# Patient Record
Sex: Female | Born: 1995 | Race: White | Hispanic: Yes | Marital: Single | Smoking: Never smoker
Health system: Southern US, Community
[De-identification: ages and names within clinical notes are randomized; demographics above are authoritative.]

## PROBLEM LIST (undated history)

## (undated) DIAGNOSIS — F419 Anxiety disorder, unspecified: Secondary | ICD-10-CM

## (undated) DIAGNOSIS — R000 Tachycardia, unspecified: Secondary | ICD-10-CM

## (undated) DIAGNOSIS — T5491XA Toxic effect of unspecified corrosive substance, accidental (unintentional), initial encounter: Secondary | ICD-10-CM

## (undated) DIAGNOSIS — D649 Anemia, unspecified: Secondary | ICD-10-CM

## (undated) DIAGNOSIS — R569 Unspecified convulsions: Secondary | ICD-10-CM

## (undated) DIAGNOSIS — F32A Depression, unspecified: Secondary | ICD-10-CM

## (undated) DIAGNOSIS — T50901A Poisoning by unspecified drugs, medicaments and biological substances, accidental (unintentional), initial encounter: Secondary | ICD-10-CM

## (undated) DIAGNOSIS — K8020 Calculus of gallbladder without cholecystitis without obstruction: Secondary | ICD-10-CM

## (undated) HISTORY — DX: Anemia, unspecified: D64.9

## (undated) HISTORY — PX: CHOLECYSTECTOMY: SHX55

## (undated) HISTORY — DX: Unspecified convulsions: R56.9

## (undated) HISTORY — DX: Tachycardia, unspecified: R00.0

## (undated) HISTORY — DX: Depression, unspecified: F32.A

## (undated) HISTORY — DX: Anxiety disorder, unspecified: F41.9

## (undated) HISTORY — DX: Calculus of gallbladder without cholecystitis without obstruction: K80.20

---

## 2021-05-09 ENCOUNTER — Emergency Department (HOSPITAL_COMMUNITY): Payer: 59

## 2021-05-09 ENCOUNTER — Emergency Department (HOSPITAL_COMMUNITY)
Admission: EM | Admit: 2021-05-09 | Discharge: 2021-05-09 | Disposition: A | Discharge status: Home / Self Care | Payer: 59 | Attending: Emergency Medicine | Admitting: Emergency Medicine

## 2021-05-09 ENCOUNTER — Other Ambulatory Visit: Payer: Self-pay

## 2021-05-09 ENCOUNTER — Encounter (HOSPITAL_COMMUNITY): Payer: Self-pay

## 2021-05-09 DIAGNOSIS — R072 Precordial pain: Secondary | ICD-10-CM

## 2021-05-09 DIAGNOSIS — R0602 Shortness of breath: Secondary | ICD-10-CM

## 2021-05-09 DIAGNOSIS — T50901A Poisoning by unspecified drugs, medicaments and biological substances, accidental (unintentional), initial encounter: Secondary | ICD-10-CM

## 2021-05-09 HISTORY — DX: Poisoning by unspecified drugs, medicaments and biological substances, accidental (unintentional), initial encounter: T50.901A

## 2021-05-09 HISTORY — DX: Toxic effect of unspecified corrosive substance, accidental (unintentional), initial encounter: T54.91XA

## 2021-05-09 LAB — BASIC METABOLIC PANEL
Anion gap: 8 (ref 5–15)
BUN: 11 mg/dL (ref 6–20)
CO2: 22 mmol/L (ref 22–32)
Calcium: 8.8 mg/dL — ABNORMAL LOW (ref 8.9–10.3)
Chloride: 105 mmol/L (ref 98–111)
Creatinine, Ser: 0.53 mg/dL (ref 0.44–1.00)
GFR, Estimated: >60 mL/min (ref >60)
Glucose, Bld: 92 mg/dL (ref 70–99)
Potassium: 3.8 mmol/L (ref 3.5–5.1)
Sodium: 135 mmol/L (ref 135–145)

## 2021-05-09 LAB — CBC
HCT: 37.5 % (ref 36.0–46.0)
Hemoglobin: 12.2 g/dL (ref 12.0–15.0)
MCH: 27 pg (ref 26.0–34.0)
MCHC: 32.5 g/dL (ref 30.0–36.0)
MCV: 83 fL (ref 80.0–100.0)
Platelets: 267 10*3/uL (ref 150–400)
RBC: 4.52 MIL/uL (ref 3.87–5.11)
RDW: 13.2 % (ref 11.5–15.5)
WBC: 8.5 10*3/uL (ref 4.0–10.5)
nRBC: 0 % (ref 0.0–0.2)

## 2021-05-09 LAB — I-STAT BETA HCG BLOOD, ED (MC, WL, AP ONLY): I-stat hCG, quantitative: <5 m[IU]/mL (ref <5)

## 2021-05-09 LAB — TROPONIN I (HIGH SENSITIVITY)
Troponin I (High Sensitivity): <2 ng/L (ref <18)
Troponin I (High Sensitivity): <2 ng/L (ref <18)

## 2021-05-09 LAB — D-DIMER, QUANTITATIVE: D-Dimer, Quant: 0.35 ug/mL-FEU (ref 0.00–0.50)

## 2021-05-09 MED ORDER — IBUPROFEN 800 MG PO TABS: 800.0000 mg | ORAL_TABLET | Freq: Three times a day (TID) | ORAL | 0 refills | Status: DC

## 2021-05-09 MED ORDER — PANTOPRAZOLE SODIUM 40 MG PO TBEC: 40.0000 mg | DELAYED_RELEASE_TABLET | Freq: Every day | ORAL | 0 refills | Status: DC

## 2021-05-09 MED ORDER — ALBUTEROL SULFATE HFA 108 (90 BASE) MCG/ACT IN AERS: 2.0000 | INHALATION_SPRAY | RESPIRATORY_TRACT | Status: DC | PRN

## 2021-05-09 MED ORDER — ALBUTEROL SULFATE HFA 108 (90 BASE) MCG/ACT IN AERS: 1.0000 | INHALATION_SPRAY | Freq: Four times a day (QID) | RESPIRATORY_TRACT | 0 refills | Status: DC | PRN

## 2021-05-09 NOTE — Discharge Instructions (Signed)
You were seen in the emergency department today with chest pain and trouble breathing.  I am starting on medications to help with symptoms.  We did not find evidence of a heart attack, pneumonia, blood clot in the lungs.  Please establish care with a PCP as soon as possible for follow-up.  If develop new or suddenly worsening symptoms please return to the emergency department for reevaluation.

## 2021-05-09 NOTE — ED Triage Notes (Signed)
Pt c/o of intermittent SHOB x 1 month and c/p on and off for last couple of weeks.   HX: tachycardia, anxiety

## 2021-05-09 NOTE — ED Provider Notes (Signed)
Emergency Department Provider Note   I have reviewed the triage vital signs and the nursing notes.   HISTORY  Chief Complaint Shortness of Breath   HPI Courtney Peck is a 26 y.o. female presents to the ED with intermittent CP and SOB. Symptoms have been ongoing for the last 2-3 weeks. Symptoms are intermittent and without obvious radiation. Pain is sharp in nature. No radiation. Past history of anxiety noted by patient.     Past Medical History:  Diagnosis Date   Bleach ingestion    Overdose     Review of Systems  Constitutional: No fever/chills Eyes: No visual changes. ENT: No sore throat. Cardiovascular: Positive chest pain. Respiratory: Positive shortness of breath. Gastrointestinal: No abdominal pain.  No nausea, no vomiting.  No diarrhea.  No constipation. Genitourinary: Negative for dysuria. Musculoskeletal: Negative for back pain. Skin: Negative for rash. Neurological: Negative for headaches, focal weakness or numbness.  10-point ROS otherwise negative.  ____________________________________________   PHYSICAL EXAM:  VITAL SIGNS: ED Triage Vitals  Enc Vitals Group     BP 05/09/21 1152 130/85     Pulse Rate 05/09/21 1152 73     Resp 05/09/21 1628 16     Temp 05/09/21 1152 98.1 F (36.7 C)     Temp Source 05/09/21 1152 Oral     SpO2 05/09/21 1152 100 %   Constitutional: Alert and oriented. Well appearing and in no acute distress. Eyes: Conjunctivae are normal.  Head: Atraumatic. Nose: No congestion/rhinnorhea. Mouth/Throat: Mucous membranes are moist.  Neck: No stridor. Cardiovascular: Normal rate, regular rhythm. Good peripheral circulation. Grossly normal heart sounds.   Respiratory: Normal respiratory effort.  No retractions. Lungs CTAB. Gastrointestinal: No distention.  Musculoskeletal: No gross deformities of extremities. Neurologic:  Normal speech and language.   ____________________________________________   LABS (all labs  ordered are listed, but only abnormal results are displayed)  Labs Reviewed  BASIC METABOLIC PANEL - Abnormal; Notable for the following components:      Result Value   Calcium 8.8 (*)    All other components within normal limits  CBC  D-DIMER, QUANTITATIVE  I-STAT BETA HCG BLOOD, ED ([facility], [facility], [facility] ONLY)  TROPONIN I (HIGH SENSITIVITY)  TROPONIN I (HIGH SENSITIVITY)   ____________________________________________  EKG   EKG Interpretation  Date/Time:  Wednesday May 09 2021 12:21:27 EST Ventricular Rate:  76 PR Interval:  146 QRS Duration: 82 QT Interval:  368 QTC Calculation: 414 R Axis:   87 Text Interpretation: Normal sinus rhythm with sinus arrhythmia Normal ECG No previous ECGs available Confirmed by Nanda Quinton [phone]) on 05/09/2021 4:34:38 PM        ____________________________________________  RADIOLOGY  DG Chest 2 View  Result Date: 05/09/2021 CLINICAL DATA:  A 26 year old female presents for evaluation of shortness of breath for 1 month. EXAM: CHEST - 2 VIEW COMPARISON:  None FINDINGS: The heart size and mediastinal contours are within normal limits. Both lungs are clear. The visualized skeletal structures are unremarkable. IMPRESSION: No active cardiopulmonary disease. Electronically Signed   By: Zetta Bills M.D.   On: 05/09/2021 13:03    ____________________________________________   PROCEDURES  Procedure(s) performed:   Procedures  None ____________________________________________   INITIAL IMPRESSION / ASSESSMENT AND PLAN / ED COURSE  Pertinent labs & imaging results that were available during my care of the patient were reviewed by me and considered in my medical decision making (see chart for details).   This patient is Presenting for Evaluation of CP and SOB, which  does require a range of treatment options, and is a complaint that involves a high risk of morbidity and mortality.  The Differential Diagnoses includes all life-threatening causes  for chest pain. This includes but is not exclusive to acute coronary syndrome, aortic dissection, pulmonary embolism, cardiac tamponade, community-acquired pneumonia, pericarditis, musculoskeletal chest wall pain, etc.   Medical Decision Making: Summary:  Patient presents to the ED with atypical CP and SOB. No fever or chills. EKG interpreted by me as above. Doubt ACS with few risk factors and low risk HEART score. CXR ordered.     Reevaluation with update and discussion with patient. Labs reviewed along with plan for symptom mgmt and PCP follow up.     Clinical Laboratory Tests Ordered, included CBC, CMP, d-dimer, and troponin. Pregnancy test is negative. With atypical pain d-dimer was ordered and negative. Troponin negative.  Radiologic Tests Ordered, included CXR with no acute findings upon my independent evaluation. Considered CTA chest but patient is low risk for PE and d-dimer is negative.    ____________________________________________  FINAL CLINICAL IMPRESSION(S) / ED DIAGNOSES  Final diagnoses:  SOB (shortness of breath)  Precordial chest pain     NEW OUTPATIENT MEDICATIONS STARTED DURING THIS VISIT:  Discharge Medication List as of 05/09/2021  6:30 PM     START taking these medications   Details  albuterol (VENTOLIN HFA) 108 (90 Base) MCG/ACT inhaler Inhale 1-2 puffs into the lungs every 6 (six) hours as needed for wheezing or shortness of breath., Starting Wed 05/09/2021, Normal    ibuprofen (ADVIL) 800 MG tablet Take 1 tablet (800 mg total) by mouth 3 (three) times daily., Starting Wed 05/09/2021, Normal    pantoprazole (PROTONIX) 40 MG tablet Take 1 tablet (40 mg total) by mouth daily., Starting Wed 05/09/2021, Normal        Note:  This document was prepared using Dragon voice recognition software and may include unintentional dictation errors.  Nanda Quinton, MD, [facility]    Abdurrahman Petersheim, [facility], MD 05/13/21 [phone]

## 2021-05-09 NOTE — ED Provider Triage Note (Signed)
Emergency Medicine Provider Triage Evaluation Note  Courtney Peck , a 26 y.o. female  was evaluated in triage.  Pt complains of shortness of breath x1 month associated with intermittent chest pain for the past couple weeks.  Patient states chest pain is on both her right and left side of her anterior chest wall and under her left breast.  Chest pain worse with deep inspiration.  No history of blood clots. She is currently on birth control.  Denies lower extremity edema.  No cardiac history.  History of tachycardia and anxiety. No history of asthma.   Review of Systems  Positive: CP, SOB Negative: fever  Physical Exam  BP 130/85    Pulse 73    Temp 98.1 F (36.7 C) (Oral)    SpO2 100%  Gen:   Awake, no distress   Resp:  Normal effort  MSK:   Moves extremities without difficulty  Other:    Medical Decision Making  Medically screening exam initiated at 12:17 PM.  Appropriate orders placed.  Courtney Peck was informed that the remainder of the evaluation will be completed by another provider, this initial triage assessment does not replace that evaluation, and the importance of remaining in the ED until their evaluation is complete.  Cardiac labs CXR   Jesusita Oka 05/09/21 1224

## 2021-05-10 ENCOUNTER — Emergency Department (HOSPITAL_COMMUNITY)
Admission: EM | Admit: 2021-05-10 | Discharge: 2021-05-11 | Disposition: A | Discharge status: Home / Self Care | Payer: 59 | Attending: Student | Admitting: Student

## 2021-05-10 ENCOUNTER — Other Ambulatory Visit: Payer: Self-pay

## 2021-05-10 DIAGNOSIS — R42 Dizziness and giddiness: Secondary | ICD-10-CM | POA: Insufficient documentation

## 2021-05-10 DIAGNOSIS — R0789 Other chest pain: Secondary | ICD-10-CM | POA: Diagnosis not present

## 2021-05-10 DIAGNOSIS — R0602 Shortness of breath: Secondary | ICD-10-CM | POA: Insufficient documentation

## 2021-05-10 DIAGNOSIS — Z20822 Contact with and (suspected) exposure to covid-19: Secondary | ICD-10-CM | POA: Insufficient documentation

## 2021-05-10 LAB — RESP PANEL BY RT-PCR (FLU A&B, COVID) ARPGX2
Influenza A by PCR: NEGATIVE
Influenza B by PCR: NEGATIVE
SARS Coronavirus 2 by RT PCR: NEGATIVE

## 2021-05-10 NOTE — ED Provider Triage Note (Signed)
Emergency Medicine Provider Triage Evaluation Note  Courtney Peck , a 26 y.o. female  was evaluated in triage.  Pt complains of chest pain and shortness of breath.  Patient states that she was here yesterday but symptoms have been worsening.  She endorses having chest heaviness and pressure that is associated with shortness of breath and feeling like she cannot get a deep breath.  She states that since yesterday she has had 2 episodes of "convulsing."  She states that she was awake during these episodes and was shaking all over.  Additionally she states that her lightheadedness is worsening.  Denies fevers or cough.  She denies currently being anxious.  Review of Systems  Positive: See above Negative:   Physical Exam  BP (!) 115/98 (BP Location: Right Arm)    Pulse 93    Temp 99.1 F (37.3 C)    Resp 18    SpO2 100%  Gen:   Awake, no distress   Resp:  Normal effort  MSK:   Moves extremities without difficulty  Other:  S1/S2 without murmur Patient denies feeling anxious however she appears objectively anxious.  Offered her Atarax in triage but she declines. Medical Decision Making  Medically screening exam initiated at 9:46 PM.  Appropriate orders placed.  Courtney Peck was informed that the remainder of the evaluation will be completed by another provider, this initial triage assessment does not replace that evaluation, and the importance of remaining in the ED until their evaluation is complete.  She had full labs yesterday including D-dimer, troponin, i-STAT beta-hCG, CBC, BMP all of which were in normal limits.  Will perform EKG and COVID/flu testing.   Cristopher Peru, PA-C 05/10/21 2148

## 2021-05-10 NOTE — ED Triage Notes (Signed)
Patient reports SOB and "inability to take a deep breath". Chest heaviness and pressure. Patient reports chest tender to touch, shaking, "convulsing", lightheadedness. Worsening over last two days. Patient seen here yesterday for same but states that sx are worsening.  Patient D/C with inhaler yesterday. Did use today with no improvement.

## 2021-05-11 LAB — COMPREHENSIVE METABOLIC PANEL
ALT: 10 U/L (ref 0–44)
AST: 23 U/L (ref 15–41)
Albumin: 4.1 g/dL (ref 3.5–5.0)
Alkaline Phosphatase: 50 U/L (ref 38–126)
Anion gap: 13 (ref 5–15)
BUN: 9 mg/dL (ref 6–20)
CO2: 21 mmol/L — ABNORMAL LOW (ref 22–32)
Calcium: 9.4 mg/dL (ref 8.9–10.3)
Chloride: 103 mmol/L (ref 98–111)
Creatinine, Ser: 0.6 mg/dL (ref 0.44–1.00)
GFR, Estimated: >60 mL/min (ref >60)
Glucose, Bld: 65 mg/dL — ABNORMAL LOW (ref 70–99)
Potassium: 4.2 mmol/L (ref 3.5–5.1)
Sodium: 137 mmol/L (ref 135–145)
Total Bilirubin: 0.4 mg/dL (ref 0.3–1.2)
Total Protein: 7.6 g/dL (ref 6.5–8.1)

## 2021-05-11 LAB — CBC WITH DIFFERENTIAL/PLATELET
Abs Immature Granulocytes: 0.05 10*3/uL (ref 0.00–0.07)
Basophils Absolute: 0 10*3/uL (ref 0.0–0.1)
Basophils Relative: 0 %
Eosinophils Absolute: 0.1 10*3/uL (ref 0.0–0.5)
Eosinophils Relative: 1 %
HCT: 40.2 % (ref 36.0–46.0)
Hemoglobin: 13.2 g/dL (ref 12.0–15.0)
Immature Granulocytes: 0 %
Lymphocytes Relative: 28 %
Lymphs Abs: 4.6 10*3/uL — ABNORMAL HIGH (ref 0.7–4.0)
MCH: 27.3 pg (ref 26.0–34.0)
MCHC: 32.8 g/dL (ref 30.0–36.0)
MCV: 83.1 fL (ref 80.0–100.0)
Monocytes Absolute: 0.7 10*3/uL (ref 0.1–1.0)
Monocytes Relative: 4 %
Neutro Abs: 11.1 10*3/uL — ABNORMAL HIGH (ref 1.7–7.7)
Neutrophils Relative %: 67 %
Platelets: 318 10*3/uL (ref 150–400)
RBC: 4.84 MIL/uL (ref 3.87–5.11)
RDW: 13.3 % (ref 11.5–15.5)
WBC: 16.5 10*3/uL — ABNORMAL HIGH (ref 4.0–10.5)
nRBC: 0 % (ref 0.0–0.2)

## 2021-05-11 NOTE — ED Notes (Signed)
Pt left AMA °

## 2021-07-10 ENCOUNTER — Other Ambulatory Visit: Payer: Self-pay | Admitting: Psychiatry

## 2021-07-10 DIAGNOSIS — R251 Tremor, unspecified: Secondary | ICD-10-CM

## 2021-07-26 ENCOUNTER — Ambulatory Visit
Admission: RE | Admit: 2021-07-26 | Discharge: 2021-07-26 | Disposition: A | Discharge status: Home / Self Care | Payer: 59 | Source: Ambulatory Visit | Attending: Psychiatry | Admitting: Psychiatry

## 2021-07-26 ENCOUNTER — Other Ambulatory Visit: Payer: Self-pay

## 2021-07-26 DIAGNOSIS — R251 Tremor, unspecified: Secondary | ICD-10-CM

## 2022-12-17 IMAGING — MR MR HEAD W/O CM
12 series · 48 of 48 positions shown · non-contrast
Comparison: No pertinent prior exams available for comparison.

CLINICAL DATA: Spells of trembling. Additional history provided by
scanning technologist: Patient reports episodes of whole-body
trembling. Balance and speech difficulty.

EXAM:
MRI HEAD WITHOUT CONTRAST
TECHNIQUE: Multiplanar, multiecho pulse sequences of the brain and surrounding
structures were obtained without intravenous contrast.

[Series 2: T1 · sagittal · 5.0mm · 0.45mm/px · 2 of 21 slices shown]
[im 1/21]
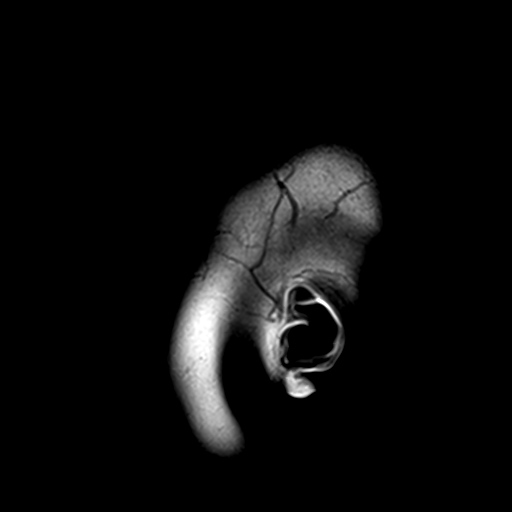
[im 21/21]
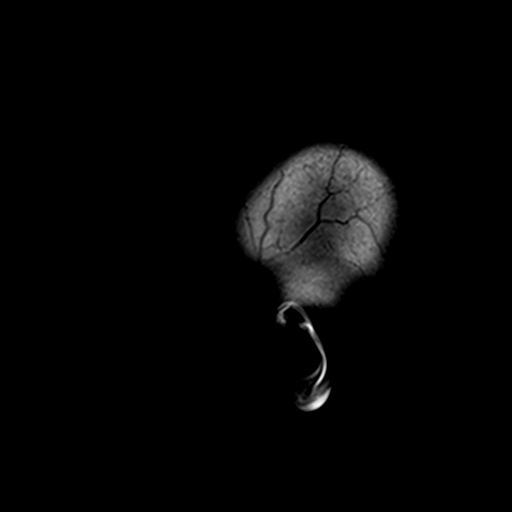

[Series 3: DWI · axial · 3.0mm · 1.80mm/px · z∈[-35,+112]mm · 8 of 100 slices shown (1 of 4)]
[im 1/100]
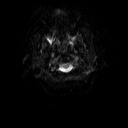
[im 15/100]
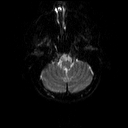
[im 29/100]
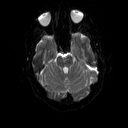
[im 43/100]
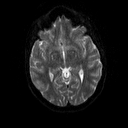
[im 57/100]
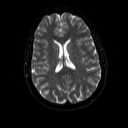
[im 71/100]
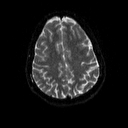
[im 85/100]
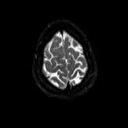
[im 100/100]
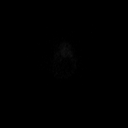

[Series 4: DWI · axial · 3.0mm · 1.80mm/px · z∈[-35,+112]mm · 4 of 48 slices shown (2 of 4)]
[im 1/48]
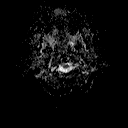
[im 16/48]
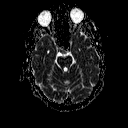
[im 32/48]
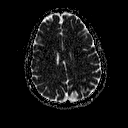
[im 48/48]
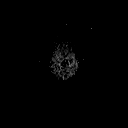

[Series 5: DWI · coronal · 5.0mm · 1.80mm/px · 6 of 69 slices shown (3 of 4)]
[im 1/69]
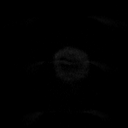
[im 14/69]
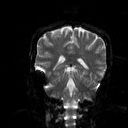
[im 28/69]
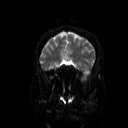
[im 41/69]
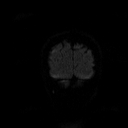
[im 55/69]
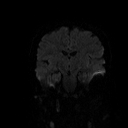
[im 69/69]
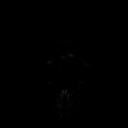

[Series 6: DWI · coronal · 5.0mm · 1.80mm/px · 3 of 36 slices shown (4 of 4)]
[im 1/36]
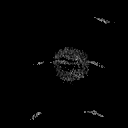
[im 18/36]
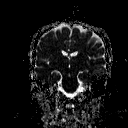
[im 36/36]
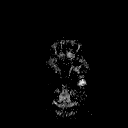

[Series 7: T2 · axial · 5.0mm · 0.51mm/px · z∈[-41,+106]mm · 2 of 22 slices shown (1 of 3)]
[im 1/22]
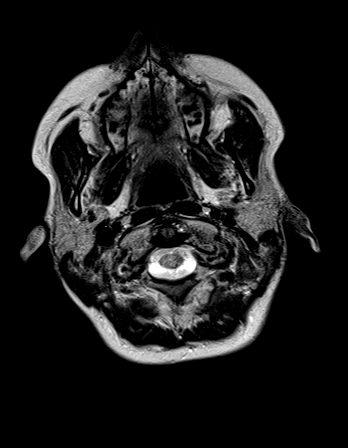
[im 22/22]
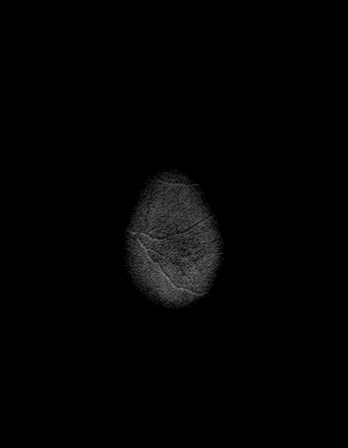

[Series 8: FLAIR · axial · 3.0mm · 0.45mm/px · z∈[-36,+99]mm · 2 of 30 slices shown (1 of 2)]
[im 1/30]
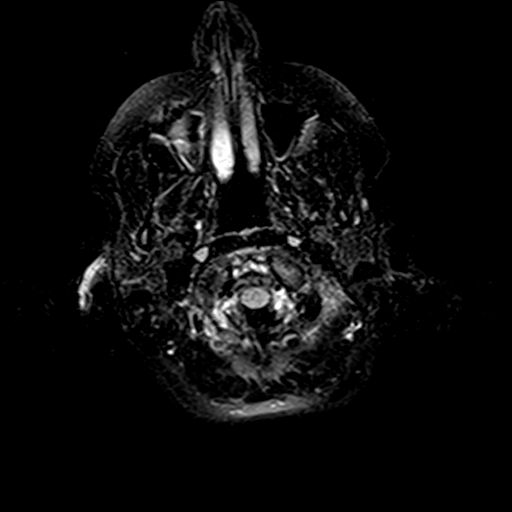
[im 30/30]
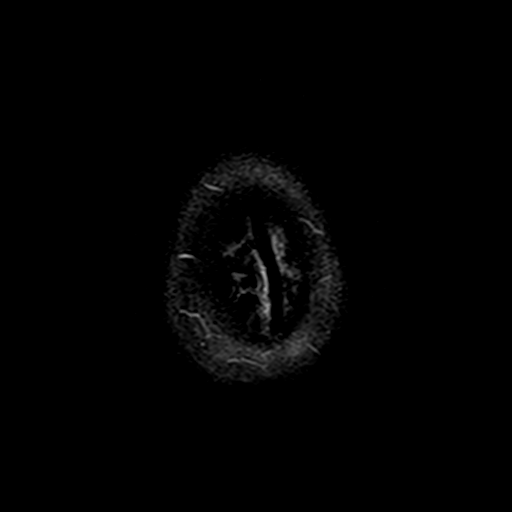

[Series 10: swi_images · axial · 4.0mm · 0.90mm/px · z∈[-38,+102]mm · 3 of 36 slices shown]
[im 1/36]
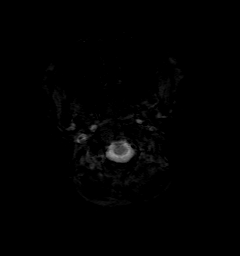
[im 18/36]
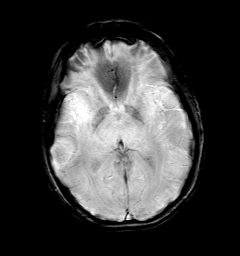
[im 36/36]
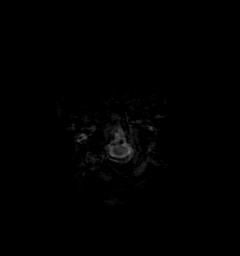

[Series 11: t1_mpr_tra · axial · 1.0mm · 0.71mm/px · z∈[-39,+104]mm · 12 of 144 slices shown]
[im 1/144]
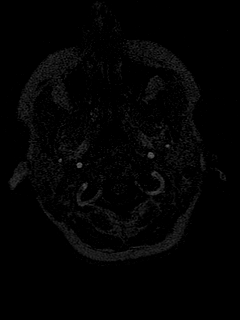
[im 14/144]
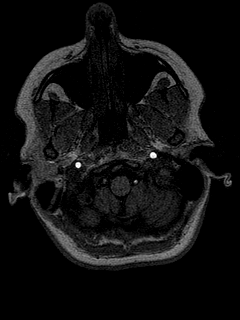
[im 27/144]
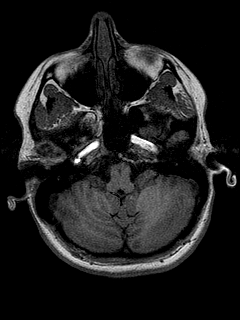
[im 40/144]
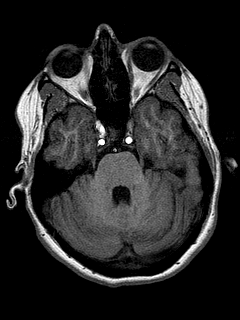
[im 53/144]
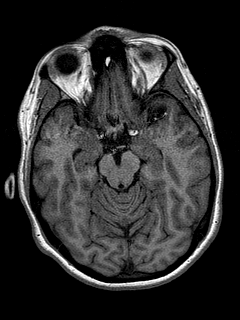
[im 66/144]
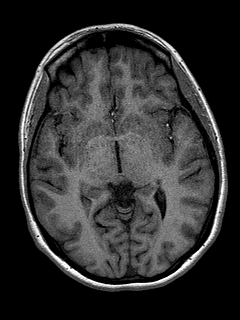
[im 79/144]
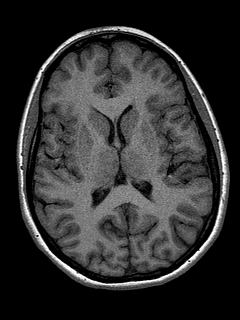
[im 92/144]
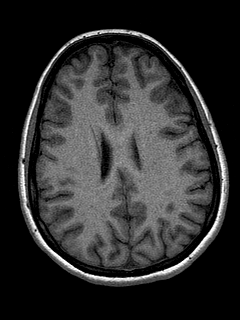
[im 105/144]
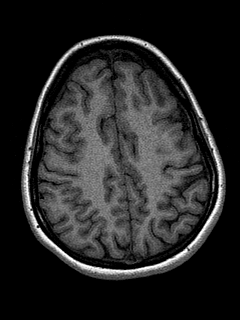
[im 118/144]
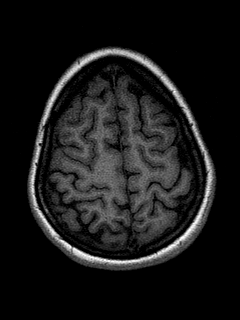
[im 131/144]
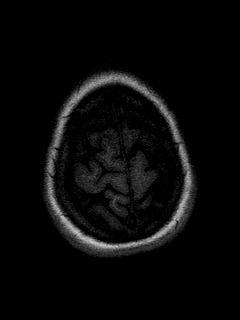
[im 144/144]
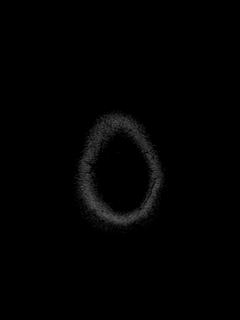

[Series 12: T2 · coronal · 3.0mm · 0.23mm/px · 2 of 28 slices shown (2 of 3)]
[im 1/28]
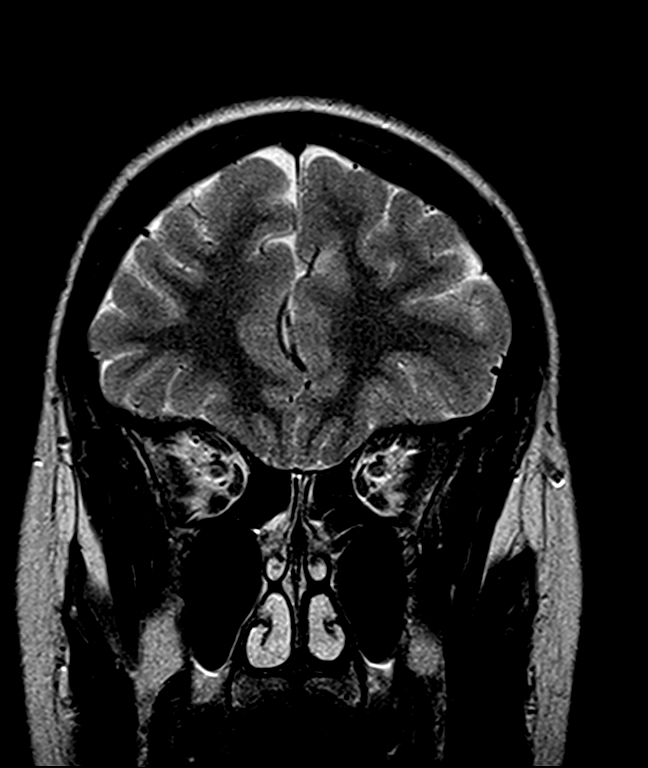
[im 28/28]
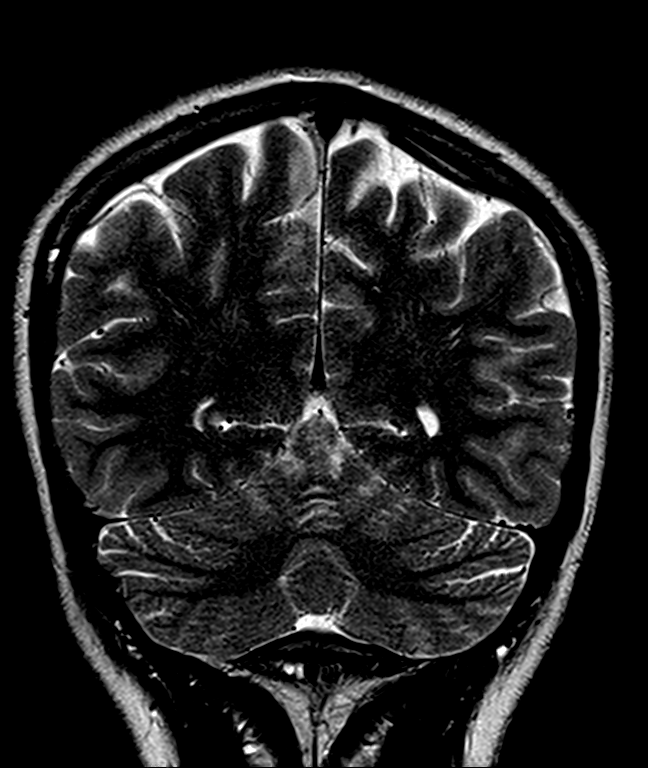

[Series 13: FLAIR · coronal · 3.0mm · 0.70mm/px · 2 of 28 slices shown (2 of 2)]
[im 1/28]
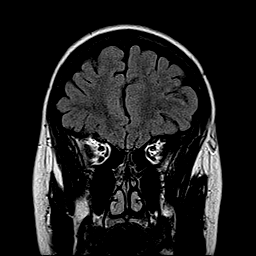
[im 28/28]
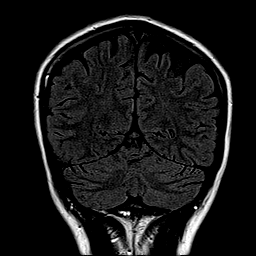

[Series 14: T2 · coronal · 5.0mm · 0.45mm/px · 2 of 25 slices shown (3 of 3)]
[im 1/25]
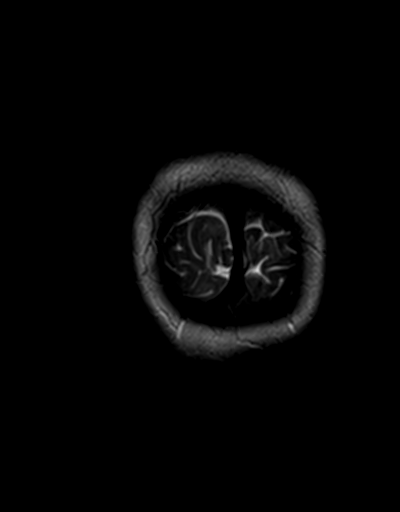
[im 25/25]
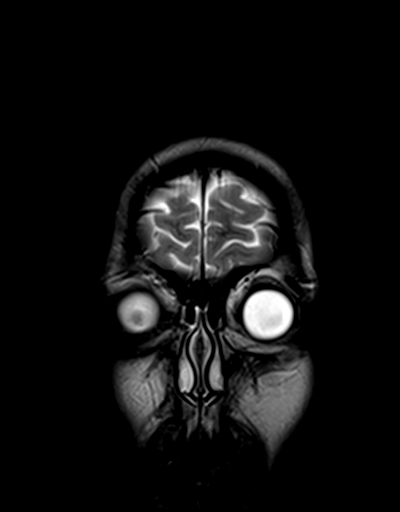

[48 of 48 positions shown; findings below may reference images not displayed]

FINDINGS: Brain:

Cerebral volume is normal.

No cortical encephalomalacia is identified. No significant cerebral
white matter disease.

The hippocampi are symmetric in size and signal.

There is no acute infarct.

No evidence of an intracranial mass.

No chronic intracranial blood products.

No extra-axial fluid collection.

No midline shift.

Vascular: Maintained flow voids within the proximal large arterial
vessels.

Skull and upper cervical spine: No focal suspicious marrow lesion.

Sinuses/Orbits: Visualized orbits show no acute finding. Minimal
mucosal thickening within the anterior right ethmoid air cells. Mild
mucosal thickening within the right maxillary sinus.
IMPRESSION: Unremarkable non-contrast MRI appearance of the brain. No evidence
of acute intracranial abnormality.

No specific seizure focus is identified.

Mild right ethmoid and maxillary sinus disease, as described.

## 2023-10-24 ENCOUNTER — Other Ambulatory Visit (HOSPITAL_COMMUNITY): Payer: Self-pay

## 2023-10-24 ENCOUNTER — Ambulatory Visit: Admitting: Diagnostic Neuroimaging

## 2023-10-24 ENCOUNTER — Telehealth: Payer: Self-pay

## 2023-10-24 ENCOUNTER — Encounter: Payer: Self-pay | Admitting: Diagnostic Neuroimaging

## 2023-10-24 ENCOUNTER — Other Ambulatory Visit: Payer: Self-pay | Admitting: Diagnostic Neuroimaging

## 2023-10-24 VITALS — BP 118/61 | HR 61 | Ht 65.5 in | Wt 162.6 lb

## 2023-10-24 DIAGNOSIS — G43109 Migraine with aura, not intractable, without status migrainosus: Secondary | ICD-10-CM | POA: Diagnosis not present

## 2023-10-24 DIAGNOSIS — F809 Developmental disorder of speech and language, unspecified: Secondary | ICD-10-CM

## 2023-10-24 MED ORDER — RIZATRIPTAN BENZOATE 10 MG PO TBDP: 10.0000 mg | ORAL_TABLET | ORAL | 11 refills | Status: DC | PRN

## 2023-10-24 NOTE — Progress Notes (Signed)
 [facility]  PATIENT: Courtney Peck DOB: Apr 30, 1996  REFERRING CLINICIAN: No ref. provider found HISTORY FROM: patient  REASON FOR VISIT: new consult   HISTORICAL  CHIEF COMPLAINT:  Chief Complaint  Patient presents with   New Patient (Initial Visit)    Rm 7, alone.  Issues with thought and language process/ memory 2022 sz hx.  No family hx migraines. Headaches.  Motrin  not help. Duration 1-2.5 days. Intense pain.  Lights/sounds.     HISTORY OF PRESENT ILLNESS:   28 year old female here for evaluation of abnormal spells, language and motor difficulty.  January 5 to May 12, 2021 patient had 4 episodes of shortness of breath, shakiness, weakness and confusion.  1 of these episodes patient passed out.  She was under increased stress and sleep deprivation around that time.  Had neurology evaluation including MRI and EEG which were unremarkable.  Symptoms were felt to be related to stress and insomnia, and a form of nonepileptic spell.  No recurrent spell since that time.  However since that time patient has had some generalized word finding, language, motor difficulty problems that have gradually improved.  She feels 90% back to baseline for her motor abilities.  She feels 70% back to normal for her language abilities.  She is in the process of completing her masters degree and feels like the language issues are starting to affect her.  Patient also has history of headaches with migraine features.  She has short lasting headaches lasting 20 minutes, severe and intense associated with nausea, sensitive to light and sound, frontal and temporal pain, 1 headache every 3 months.  Symptoms sees visual symptoms, like static or Zebra pattern at onset.  She has other type of longer-lasting headaches lasting hours or days at a time that occur once or twice every 2 weeks.    REVIEW OF SYSTEMS: Full 14 system review of systems performed and negative with exception of: as  per HPI.  ALLERGIES: No Known Allergies  HOME MEDICATIONS: Outpatient Medications Prior to Visit  Medication Sig Dispense Refill   Multiple Vitamins-Minerals (MULTIVITAMIN WOMEN) TABS Take 1 tablet by mouth daily.     TRI-LO-MARZIA 0.18/0.215/0.25 MG-25 MCG tab Take 1 tablet by mouth daily.     albuterol  (VENTOLIN  HFA) 108 (90 Base) MCG/ACT inhaler Inhale 1-2 puffs into the lungs every 6 (six) hours as needed for wheezing or shortness of breath. 6.7 g 0   ibuprofen  (ADVIL ) 800 MG tablet Take 1 tablet (800 mg total) by mouth 3 (three) times daily. 21 tablet 0   pantoprazole  (PROTONIX ) 40 MG tablet Take 1 tablet (40 mg total) by mouth daily. 30 tablet 0   No facility-administered medications prior to visit.    PAST MEDICAL HISTORY: Past Medical History:  Diagnosis Date   Anemia    Bleach ingestion    high school   Gallstone    Overdose    high school   Tachycardia    history of 2016, has seen cardiologist    PAST SURGICAL HISTORY: Past Surgical History:  Procedure Laterality Date   CHOLECYSTECTOMY      FAMILY HISTORY: Family History  Problem Relation Age of Onset   Cancer Mother    Seizures Maternal Grandmother    Leukemia Paternal Grandfather     SOCIAL HISTORY: Social History   Socioeconomic History   Marital status: Single    Spouse name: Not on file   Number of children: Not on file   Years of education: Not on  file   Highest education level: Not on file  Occupational History   Not on file  Tobacco Use   Smoking status: Former    Types: Cigarettes   Smokeless tobacco: Never   Tobacco comments:    Quit 2015. 2 packs a week.  Vaping Use   Vaping status: Never Used  Substance and Sexual Activity   Alcohol use: Yes    Comment: occasionally   Drug use: Never   Sexual activity: Yes  Other Topics Concern   Not on file  Social History Narrative   Caffiene 1 cups daily or 2 shots expresso daily   Working Gaffer (teach).    Live partner, 1  cat (indoor).   Social Drivers of Corporate investment banker Strain: Not on file  Food Insecurity: Not on file  Transportation Needs: Not on file  Physical Activity: Not on file  Stress: Not on file  Social Connections: Not on file  Intimate Partner Violence: Not on file    PHYSICAL EXAM  GENERAL EXAM/CONSTITUTIONAL: Vitals:  Vitals:   10/24/23 1042  BP: 118/61  Pulse: 61  Weight: 162 lb 9.6 oz (73.8 kg)  Height: 5' 5.5 (1.664 m)   Body mass index is 26.65 kg/m. Wt Readings from Last 3 Encounters:  10/24/23 162 lb 9.6 oz (73.8 kg)  05/09/21 150 lb (68 kg)   Patient is in no distress; well developed, nourished and groomed; neck is supple  CARDIOVASCULAR: Examination of carotid arteries is normal; no carotid bruits Regular rate and rhythm, no murmurs Examination of peripheral vascular system by observation and palpation is normal  EYES: Ophthalmoscopic exam of optic discs and posterior segments is normal; no papilledema or hemorrhages No results found.  MUSCULOSKELETAL: Gait, strength, tone, movements noted in Neurologic exam below  NEUROLOGIC: MENTAL STATUS:      No data to display         awake, alert, oriented to person, place and time recent and remote memory intact normal attention and concentration language fluent, comprehension intact, naming intact fund of knowledge appropriate  CRANIAL NERVE:  2nd - no papilledema on fundoscopic exam 2nd, 3rd, 4th, 6th - pupils equal and reactive to light, visual fields full to confrontation, extraocular muscles intact, no nystagmus 5th - facial sensation symmetric 7th - facial strength symmetric 8th - hearing intact 9th - palate elevates symmetrically, uvula midline 11th - shoulder shrug symmetric 12th - tongue protrusion midline  MOTOR:  normal bulk and tone, full strength in the BUE, BLE  SENSORY:  normal and symmetric to light touch, temperature, vibration  COORDINATION:  finger-nose-finger, fine  finger movements normal  REFLEXES:  deep tendon reflexes present and symmetric  GAIT/STATION:  narrow based gait     DIAGNOSTIC DATA (LABS, IMAGING, TESTING) - I reviewed patient records, labs, notes, testing and imaging myself where available.  Lab Results  Component Value Date   WBC 16.5 (H) 05/11/2021   HGB 13.2 05/11/2021   HCT 40.2 05/11/2021   MCV 83.1 05/11/2021   PLT 318 05/11/2021      Component Value Date/Time   NA 137 05/11/2021 0157   K 4.2 05/11/2021 0157   CL 103 05/11/2021 0157   CO2 21 (L) 05/11/2021 0157   GLUCOSE 65 (L) 05/11/2021 0157   BUN 9 05/11/2021 0157   CREATININE 0.60 05/11/2021 0157   CALCIUM 9.4 05/11/2021 0157   PROT 7.6 05/11/2021 0157   ALBUMIN 4.1 05/11/2021 0157   AST 23 05/11/2021 0157   ALT  10 05/11/2021 0157   ALKPHOS 50 05/11/2021 0157   BILITOT 0.4 05/11/2021 0157   GFRNONAA >60 05/11/2021 0157   No results found for: CHOL, HDL, LDLCALC, LDLDIRECT, TRIG, CHOLHDL No results found for: ZOXW9U No results found for: VITAMINB12 No results found for: TSH   07/26/21 MRI brain  - Unremarkable non-contrast MRI appearance of the brain. No evidence of acute intracranial abnormality. - No specific seizure focus is identified. - Mild right ethmoid and maxillary sinus disease, as described.  07/19/21 EEG - Interpretation  This is a normal awake and asleep EEG  - Clinical correlation  A normal EEG does not rule out a seizure  disorder.  Clinical correlation is required.    ASSESSMENT AND PLAN  28 y.o. year old female here with:   Dx:  1. Migraine with aura and without status migrainosus, not intractable      PLAN:  MIGRAINE WITH AURA (~2-4 days per month)  MIGRAINE PREVENTION  LIFESTYLE CHANGES -Stop or avoid smoking -Decrease or avoid caffeine / alcohol -Eat and sleep on a regular schedule -Exercise several times per week  Consider 1st line prevention (if migraines > 4 per month) - start  topiramate 50mg  at bedtime; after 1-2 weeks increase to 50mg  twice a day; drink plenty of water - start propranolol 40mg  daily; after 1-2 weeks increase to 40mg  twice a day - start amitriptyline 25mg  at bedtime  Consider 2nd line (if 1st line not working or tolerated) - rimegepant (Nurtec) 75mg  every other day - atogepant (Qulipta) 60mg  daily - erenumab (Aimovig) 70mg  monthly (may increase to 140mg  monthly) - fremanezumab (Ajovy) 225mg  monthly (or 675mg  every 3 months) - galazanezumab (Emgality) 240mg  loading dose; then 120mg  monthly   MIGRAINE RESCUE  - ibuprofen , tylenol as needed - rizatriptan (Maxalt) 10mg  as needed for breakthrough headache; may repeat x 1 after 2 hours; max 2 tabs per day or 8 per month  Consider CGRP antagonists (if triptans not working or contraindicated) - ubrogepant Florette Hurry) 100mg  as needed for breakthrough headache; may repeat x 1 after 2 hours; max 2 tabs per day or 8 per month - rimegepant (Nurtec) 75mg  as needed for breakthrough headache; max 8 per month  MILD LANGUAGE DIFFICULTY - refer to neuropsychology testing  ABNORMAL SPELLS (4 events of SOB, shakiness, confusion; 05/10/21 - 05/12/21) - no recurrence; likely stress / insomnia induced spells  Orders Placed This Encounter  Procedures   Ambulatory referral to Neuropsychology   Meds ordered this encounter  Medications   rizatriptan (MAXALT-MLT) 10 MG disintegrating tablet    Sig: Take 1 tablet (10 mg total) by mouth as needed for migraine. May repeat in 2 hours if needed    Dispense:  9 tablet    Refill:  11   Return in about 4 months (around 02/23/2024) for MyChart visit (15 min), pending test results, pending if symptoms worsen or fail to improve.    Omega Bible, MD 10/24/2023, 11:26 AM Certified in Neurology, Neurophysiology and Neuroimaging  [facility] [address] [place], [place] [address] [phone]

## 2023-10-24 NOTE — Telephone Encounter (Signed)
 Needs PA

## 2023-10-24 NOTE — Telephone Encounter (Signed)
 Pharmacy Patient Advocate Encounter   Received notification from RX Request Messages that prior authorization for Rizatriptan 10mg  ODT is required/requested.   Insurance verification completed.   The patient is insured through [facility] .   Per test claim: PA required; PA submitted to above mentioned insurance via Fax Key/confirmation #/EOC N/A Status is pending  Faxed PA form with clinicals to [facility] at [phone]

## 2023-10-24 NOTE — Patient Instructions (Addendum)
  MIGRAINE PREVENTION  LIFESTYLE CHANGES -Stop or avoid smoking -Decrease or avoid caffeine / alcohol -Eat and sleep on a regular schedule -Exercise several times per week  MIGRAINE RESCUE  - ibuprofen , tylenol as needed - rizatriptan (Maxalt) 10mg  as needed for breakthrough headache; may repeat x 1 after 2 hours; max 2 tabs per day or 8 per month  MILD LANGUAGE DIFFICULTY - refer to neuropsychology testing  ABNORMAL SPELLS (4 events of SOB, shakiness, confusion; 05/10/21 - 05/12/21) - no recurrence; likely stress / insomnia induced spells

## 2023-10-27 NOTE — Telephone Encounter (Signed)
 Pharmacy Patient Advocate Encounter  Received notification from [facility] that Prior Authorization for Rizatriptan  ODT has been DENIED.  Full denial letter will be uploaded to the media tab. See denial reason below.   PA #/Case ID/Reference #: [id number]

## 2023-10-27 NOTE — Telephone Encounter (Signed)
 Courtney Peck from [place] has called to inform that the Rizatriptan  10mg  ODT  is not formulary, there are other meds not yet tried, if it can be Rizatriptan  10mg  without ODT it could be approved, RN may call [phone] option 3 option1(this is for the Intake team)

## 2023-10-29 MED ORDER — RIZATRIPTAN BENZOATE 10 MG PO TABS: 10.0000 mg | ORAL_TABLET | ORAL | 11 refills | Status: AC | PRN

## 2023-10-29 NOTE — Telephone Encounter (Signed)
 Spoke with patient. She only wants a medication covered by her insurance. She would like to try the Rizatriptan  regular tablet instead of ODT. Pt is aware that if she wants to switch back, she can get the ODT with a good rx coupon. Sent Rizatriptan  tablet Rx to Dr Margaret to approve.

## 2023-12-02 ENCOUNTER — Encounter: Payer: Self-pay | Admitting: Psychology

## 2024-01-06 ENCOUNTER — Encounter: Attending: Psychology | Admitting: Psychology

## 2024-01-06 ENCOUNTER — Encounter: Payer: Self-pay | Admitting: Psychology

## 2024-01-06 DIAGNOSIS — F809 Developmental disorder of speech and language, unspecified: Secondary | ICD-10-CM | POA: Diagnosis not present

## 2024-01-06 NOTE — Progress Notes (Signed)
 NEUROPSYCHOLOGICAL EVALUATION [place]. [facility]  Physical Medicine and Rehabilitation     Patient: Courtney Peck  MRN: [id number] DOB: Jul 13, 1995  Age: 28 y.o. Sex: female  Race/Ethnicity: White or Caucasian  Years of Education: 18  Referring Provider: Margaret Eduard SAUNDERS, MD  Provider/Clinical Neuropsychologist: Evalene DOROTHA Riff, PsyD  Date of Service: 01/06/2024 Start Time: 8 AM End Time: 10 AM  Location of Service:  [facility] Physical Medicine & Rehabilitation Department [place]. [facility] [address], [place] [place], [place] [address] Phone: [phone]  Billing Code/Service:            96116/96121  Individuals Present: Patient was seen unaccompanied, in-person, by the provider. 1 hour and 15 minutes spent in face-to-face clinical interview and remaining 45 minutes was spent in record review, documentation, and testing protocol construction.    PATIENT CONSENT AND CONFIDENTIALITY The patient's understanding of the reason for referral was intact. Discussed limits of confidentiality including, but not limited to, posting of final evaluation report in the patient's electronic medical record for both the patient and for the referring provider and appropriate medical professionals. Patient was given the opportunity to have their questions answered. The neuropsychological evaluation process was discussed with the patient and they consented to proceed with the evaluation.  Consent for Evaluation and Treatment: Signed: Yes Explanation of Privacy Policies: Signed: Yes Discussion of Confidentiality Limits: Yes  REASON FOR REFERRAL:The patient was referred for neuropsychological evaluation by her neurologist, Dr. Margaret, due to cognitive changes involving language functioning. HPI from the patient's initial visit with the referring provider (10/24/2023) indicated that 05/10/2021-05/12/2021 the patient had 4 episodes of shortness of breath,  shakiness, weakness and confusion... neurology evaluation including MRI and EEG which were unremarkable.  Symptoms were felt to be related to stress and insomnia, and a form of nonepileptic spell.  No recurrent spell since that time. Visit notes indicate that the patient had generalized word finding, language, and motor problems since that time but which have gradually improved. Records state that the patient reported feeling 90% back to baseline in motor skills but 70% with language abilities. Language issues are reported to be problematic for her as she works towards completion of her masters degree. Records also note a history of headaches with migraine features occurring once every few months.   Upon interview, the patient indicated she was pursuing the evaluation out of concern that her symptoms may be neurologically based and connected to the seizure-like episodes in 2023.   HISTORY OF PRESENTING CONCERNS: Per interview; Cognitive Symptom Onset & Course: The patient reported sudden onset of cognitive difficulties the day following the January 2023 episodes. She indicated that symptoms initially, rapidly worsened, and then slowly improved over the course of six months before leveling out and remaining at current levels. No clear precipitating events were identified. No clear exacerbating or ameliorating factors were identified.     Current Cognitive Complaints:  Memory: The patient described mild difficulties with short-term memory primarily limited to occasional difficulty remembering content of conversations. 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   Processing Speed: The patient indicated that she feels her processing speed is slowed relative to her historical baseline. She described indications of reduced efficiency related to losing train  of thought and her mind halting or going blank.  Attention & Concentration: The patient described a worsening  of minor premorbid tendencies towards distraction from extraneous environmental stimuli, but also broader decline in attention. She described indications of frequently losing her train of thought, interruptions from her mind going blank, difficulty with sustaining attention overall, and indications of attentional lapses. She also described indications of attention related problems in reading, needing to reread something several times to retain/comprehend it.  Language: The patient described declines and in word-finding.  She described difficulties with phonemic paraphasic errors in speech and in her writing (utilized xray rather than stingray throughout a paper about the latter). She also reported that in conversation, her sentences are sometimes unintentionally incomplete, failing to include the statement in her mind in its entirety when speaking. The patient described making word order errors in her writing as well. She indicated that it is difficult for her to notice the errors but that they are apparent to the people reading them.  Visual-Spatial: The patient is able to draw basic stimuli without notable difficulty. She described difficulties with depth perception, possibly related to motor control. She indicated she always parks her car by backing into a parking space (no rear camera used) and is able to do this without difficulty.   Executive Functioning: The described slight decreases in problem efficiency that appear linked to attention and working memory difficulties. She denied frank impairment in problem solving, and has not noticed changes in her decision making, planning, organization, or inhibitory control. She denied any broad based changes in personality or behavior overall.   Motor/Sensory Complaints:   Sensory changes: The patient denied any changes in her sense of smell, taste,  hearing, or vision. Balance/coordination difficulties: The patient described some mild lifelong difficulties with balance.  She indicated that she had some difficulty that was more pronounced and impacting her walking immediately following the seizure episodes, but which resolved after a week.  Patient described slight declines in her handwriting.  She indicated that she tends to bump into things a lot when ambulating and occasionally trips.  Descriptions of her performance in first-person shooter games suggests intact fine motor control and hand eye coordination in general. Frequent instances of dizziness/vertigo: The patient described experiences of vertigo/dizziness several times a week without clear cause. Other motor difficulties: Denied any history of tremor.  Emotional and Behavioral Functioning:  History: The patient has a history of depression, anxiety, and suicidality starting around the age of 77. Of note, she has experienced multiple traumatic events throughout her life, starting at 28 years of age. She has a history of 11 suicide attempts (overdose/poisoning) between 2012 and 2017. She received emergency medical care in some of these instances. She has a history of inpatient psychiatric admission (estimates 13). She has a history of mental health treatment involving medication and individual therapy. She reported that she was in individual therapy starting with an acute stressor in late 2023 and she continued until that provider left the practice in spring of this year. The patient indicated that she has done work on trauma within individual therapy. The last instance of suicidal ideation was in 2023.   The patient indicated that she does not feel she is currently in a depressive episode. She denied feelings of frequent sadness, anhedonia, reduced motivation, reduced energy, and denied any current suicidal ideation. She described indications of some trait-like anxiety, which is improved relative  to earlier in life. She indicated that anxiety tends to be more prominent in social settings. She denied experiencing generalized worry, difficulty relaxing, feelings of tension, and has not had  a panic attack since 2023. The patient reported some difficulty surrounding sleep, that is connected to previous trauma, as the primary trauma related difficulty she experiences at present. She described some minor checking behaviors, but limited indication of OCD symptomology overall.    The patient denied any current or past homicidal ideation, hallucination, paranoia, or clear indications of mania or other psychosis.   Sleep: Sleeps around 7-8 hours each night. Started hydroxyzine approximately one month ago and this has improved sleep onset. She indicated she usually feels rested in the morning. No history of sleep apnea. No frequent nightmares.   Appetite: Current appetite described as good. History of difficulty with eating behavior around the age of 26.  Caffeine: About one cup of coffee per day.   Alcohol Use: Minimal; Socially, 1 drink per setting,  Tobacco Use: None Recreational Substance Use: None    Level of Functional Independence: The patient is intact with basic and instrumental activities of daily living.   Medical History/Record Review: The patient indicated that she has follow-up visits planned with cardiology for evaluation of possible VST due to experience of check pains/shortness of breath occurring twice a month. This has been present for at least one year.  History of traumatic brain injury/concussion:Possible concussion around 18. Denied persistent changes.   History of stroke: No History of heart attack: No History of cancer/chemotherapy: No History of seizure activity: None other than reported above. Symptoms of chronic pain: None Experience of frequent headaches/migraines: Describes experiencing minor headaches approximately twice per week.  She does experience migraines on  average once every 2 months.  She describes some aura related symptoms.  Duration can last between 15 minutes to 2 days but she estimates average duration is 2 hours.  Past Medical History:  Diagnosis Date   Anemia    Bleach ingestion    high school   Gallstone    Overdose    high school   Tachycardia    history of 2016, has seen cardiologist   Patient Active Problem List   Diagnosis Date Noted   Overdose 05/09/2021   Imaging/Lab Results:  07/26/2021 MR BRAIN WO CONTRAST FINDINGS: Brain: Cerebral volume is normal. No cortical encephalomalacia is identified. No significant cerebral white matter disease. The hippocampi are symmetric in size and signal. There is no acute infarct. No evidence of an intracranial mass. No chronic intracranial blood products. No extra-axial fluid collection. No midline shift. Vascular: Maintained flow voids within the proximal large arterial vessels. Skull and upper cervical spine: No focal suspicious marrow lesion. Sinuses/Orbits: Visualized orbits show no acute finding. Minimalmucosal thickening within the anterior right ethmoid air cells. Mild mucosal thickening within the right maxillary sinus. IMPRESSION: Unremarkable non-contrast MRI appearance of the brain. No evidence of acute intracranial abnormality. No specific seizure focus is identified. Mild right ethmoid and maxillary sinus disease, as described.  07/19/2021 EEG - Interpretation  This is a normal awake and asleep EEG  - Clinical correlation  A normal EEG does not rule out a seizure  disorder.  Clinical correlation is required.   Family Neurologic/Medical Hx:  Family History  Problem Relation Age of Onset   Cancer Mother    Seizures Maternal Grandmother    Leukemia Paternal Grandfather    Medications:  Melatonin Hydroxyzine (recently increased from 10 mg) Iron Multivitamin rizatriptan  (MAXALT ) 10 MG tablet -  TRI-LO-MARZIA 0.18/0.215/0.25 MG-25 MCG  tab  Academic/Vocational History: Denied any learning difficulties growing up.  Denied any problems with attention and concentration.  Performed well  in school.  Currently attends [employer], recently earned masters, and is working towards PhD in psychology. She indicated that she is performing well overall.   Psychosocial: Marital Status: In a relationship.  Children/Grandchildren: None Living Situation: With partner. Daily Activities/Hobbies: Enjoys playing video games [employer] crew)  Mental Status/Behavioral Observations: The patient was seen on an outpatient basis in the [facility] PM&R office for the clinical interview unaccompanied.  Sensorium/Arousal: The patient was alert.  No difficulties with hearing or vision were noted. Orientation: Full. Appearance: Appropriate dress and hygiene. Behavior: Within normal limits overall, but the patient appeared slightly fatigued. However, the patient recent fell ill and was not feeling well during the visit.  Speech/Language: Patient speech was prosodic and well-articulated.  There were some pauses in conversation which appeared to be related to losing train of thought, word-finding, or possibly formulating a response.  No paraphasic errors were noted in conversational speech.  No clear difficulties in language comprehension, behaviorally. Motor: Patient ambulated independently without issue.  No tremor was observed. Social Comportment: Appropriate for the setting. Mood: Euthymic. Affect: Congruent. Thought Process/Content: Coherent, linear, and goal directed. Ability to Participate in Interview: Readily answered all questions posed during the clinical interview with adequate detail about personal history. Insight: Within normal limits.   SUMMARY / CLINICAL IMPRESSIONS The patient was referred for neuropsychological evaluation by her neurologist, Dr. Margaret, due to cognitive changes involving language functioning. HPI from the patient's initial visit  with the referring provider (10/24/2023) indicated that 05/10/2021-05/12/2021 the patient had 4 episodes of shortness of breath, shakiness, weakness and confusion... neurology evaluation including MRI and EEG which were unremarkable.  Symptoms were felt to be related to stress and insomnia, and a form of nonepileptic spell.  No recurrent spell since that time. Visit notes indicate that the patient had generalized word finding, language, and motor problems since that time but which have gradually improved. Records state that the patient reported feeling 90% back to baseline in motor skills but 70% with language abilities. Language issues are reported to be problematic for her as she works towards completion of her masters degree. Records also note a history of headaches with migraine features occurring once every few months.   Upon interview, the patient indicated she was pursuing the evaluation out of concern that her symptoms may be neurologically based and connected to the seizure-like episodes in 2023.  The patient described difficulties primarily with expressive language, particularly with word-finding and phonemic paraphasic errors in both speech and writing.  There is some increased difficulty with reading although this appears to be related to attentional difficulties.  Difficulties with attention involve losing train of thought, possible signs of difficulties in working memory, and sustained attention. The patient described changes in processing speed but also possible indications of her mind going blank, something often accompanying anxiety. Difficulties with short term memory are relatively minimal, and suspicious for difficulties primarily caused by attention problems. No clear difficulties in visual-spatial abilities or executive functioning. The patient has a significant psychiatric history including history of trauma. She described minimal difficulties at present, however. A formal  neuropsychological evaluation can help delineate the patient cognitive profile to assess for any indications of impairment. The provider discussed the patient's experience with cognitive testing within her graduate program to inform test selection and to account for possible impact of familiarity with some test formats. Based on patient report in conversation with the provider, the patient has not administered the specific tests which they will receive during testing.  DISPOSITION / PLAN The patient has been set up for a formal neuropsychological assessment to objectively assess her cognitive functioning across domains to establish the patient's cognitive profile. This data, in conjunction with information obtained via clinical interview and medical record review, will help clarify likely etiology and guide treatment recommendations. Once data collection and interpretation have been completed, the findings / diagnosis and recommendations will be reviewed and discussed with the patient during a feedback appointment with the neuropsychologist. Based on the collaborative dialogue with the patient during the feedback, recommendations may be adjusted / tailored as needed. A formal report will be produced and provided to the patient and the referring provider.   Diagnosis: Language difficulty (F80.9)              Evalene DOROTHA Riff, PsyD             Neuropsychologist   This report was generated using voice recognition software. While this document has been carefully reviewed, transcription errors may be present. I apologize in advance for any inconvenience. Please contact me if further clarification is needed.

## 2024-01-12 ENCOUNTER — Encounter

## 2024-01-12 DIAGNOSIS — F809 Developmental disorder of speech and language, unspecified: Secondary | ICD-10-CM | POA: Diagnosis not present

## 2024-01-12 NOTE — Progress Notes (Signed)
 Mental Status/Behavioral Observations (01/12/2024):  Sensory/Arousal: Hearing and vision were adequate for testing. The patient was alert. Appearance: Dress and hygiene were appropriate for the setting.  Speech/Language: In conversation, the patient's speech was prosodic, fluent, and well-articulated. No paraphasic errors were noted in conversational speech.  Motor: The patient ambulated independently and without issue. No tremors were observed.  Social Comportment: Social behavior was appropriate to the setting Mood/Affect: Mood was largely neutral. Affect was consistent with mood.  Attention/Concentration:  The patient participated in testing as instructed. No frank attentional lapses were observed.  Thought Process/Content: The patient's thought process was coherent, linear, goal directed. There were no indications of psychosis.  Ability to Participate in Testing / Additional Observations: The patient appeared engaged throughout testing. She occasionally asked clarifying questions if uncertain about task instructions.   Neuropsychology Note Courtney Peck completed 190 minutes of neuropsychological testing with technician, Josue Ned, BA, under the supervision of Evalene Riff, PsyD., Clinical Neuropsychologist. The patient did not appear overtly distressed by the testing session, per behavioral observation or via self-report to the technician. Rest breaks were offered.   Clinical Decision Making: In considering the patient's current level of functioning, level of presumed impairment, nature of symptoms, emotional and behavioral responses during clinical interview, level of literacy, and observed level of motivation/effort, a battery of tests was selected by Dr. Riff during initial consultation on 01/06/2024. This was communicated to the technician. Communication between the neuropsychologist and technician was ongoing throughout the testing session and changes were made as deemed  necessary based on patient performance on testing, technician observations and additional pertinent factors such as those listed above.  Tests Administered: Brief Diagnostic Aphasia Examination- Third Edition (BDAE); select subtests Brief Visuospatial Memory Test-Revised (BVMT-R) Benton Judgment of Line Orientation (JOLO) [place]  Verbal Learning Test-Third Edition (CVLT-3) Controlled Oral Word Association Test (FAS & Animals) Delis-Kaplan Executive Function System (D-KEFS), select subtests Finger Tapping Test (FTT) Neuropsychological Assessment Battery (NAB), select subtest Rey Complex Figure Test (RCFT), select subtests Wechsler Adult Intelligence Scale-Fourth Edition (WAIS-IV), select subtests Wechsler Abbreviated Scale of Intelligence [employer] fast streamer) Wechsler Memory Scale-Fourth Edition (WMS-IV) , select subtests Wechsler Memory Scale-Third Edition (WMS-III), select subtests  The Beck Depression Inventory-II (BDI-II) Beck Anxiety Inventory (BAI)  Results: Note: This summary of test scores accompanies the interpretive report and should not be interpreted by unqualified individuals or in isolation without reference to the report. Test scores are relative to age, gender, and educational history as available and appropriate. Measurement properties of test scores: IQ, Index, and Standard Scores (SS): Mean = 100; Standard Deviation = 15; Scaled Scores (ss): Mean = 10; Standard Deviation = 3; Z scores (Z): Mean = 0; Standard Deviation = 1; T scores (T); Mean = 50; Standard Deviation = 10  Intellectual/Premorbid Functioning Estimate   Norm Score Percentile  Range  WASI-II FSIQ-4  SS = 116 86 %ile Above Average   Vocabulary   t = 57 75 %ile High Average   Matrix Reasoning   t = 54 66 %ile Average   Block Design  t = 62 88 %ile High Average   Similarities  t = 62 88 %ile High Average   ATTENTION AND WORKING MEMORY    Norm Score Percentile  Range  WAIS-IV          Digit Span  ss = 11 63 %ile  Average   DSF  ss = 10 50 %ile Average   Span:    7      DSB  ss = 15 95 %ile Above Average   Span:    6      DSS  ss = 9 37 %ile Average   Span:    6      Arithmetic  ss = 11 63 %ile Average  WMS-III          Spatial Span  ss = 13 84 %ile High Average   SSF  ss = 11 63 %ile Average   Span:    7      SSB  ss = 15 95 %ile Above Average   Span:    7      PROCESSING SPEED    Norm Score Percentile Range  WAIS-IV          Coding  ss = 13 84 %ile High Average   Symbol Search  ss = 9 37 %ile Average   PSYCHOMOTION    Norm Score Percentile  Range  Finger Tapping Test - Dominant  t = 50 50 %ile Average  Finger Tapping Test - Nondominant  t = 56 73 %ile Average   LANGUAGE    Norm Score Percentile  Range  NAB Naming  t = 41 18 %ile Low Average  COWAT          FAS  t = 46 32 %ile Average   Animals  t = 35 7 %ile Below Average  DKEFS - Verbal Fluency          Category Switching  ss = 9 37 %ile Average   Switching Accuracy  ss = 11 63 %ile Average   EXECUTIVE FUNCTIONING    Norm Score Percentile  Range  DKEFS - Color-Word Interference          Color Naming  ss = 7 16 %ile Low Average   Word Reading  ss = 8 25 %ile Average   Inhibition  ss = 12 75 %ile High Average   Errors  ss = 12 75 %ile High Average   Inhibition Switching  ss = 10 50 %ile Average   Errors  ss = 11 63 %ile Average  DKEFS - Trails          Condition I  ss = 9 37 %ile Average   Condition II  ss = 13 84 %ile High Average   Condition III  ss = 13 84 %ile High Average   Condition IV  ss = 12 75 %ile High Average   Condition V  ss = 13 84 %ile High Average   MEMORY    Norm Score Percentile  Range  BVMT-R          Trial 1  t = 54.0 66 %ile Average   Trial 2  t = 57 75 %ile High Average   Trial 3  t = 59.0 81 %ile High Average   Total Recall  t = 57.0 75 %ile High Average   Learning  t = 52.0 58 %ile Average   Delayed Recall  t = 60.0 84 %ile High Average   % Retained    100 >16 %ile WNL   Hits     >16 %ile WNL    False Alarms     >16 %ile WNL   Recognition Discriminability     >16 %ile WNL  CVLT-III          Trial 1  ss = 13.0 84 %ile High Average   Trial 2  ss = 16.0 98 %ile Exceptionally High  Trial 3  ss = 13.0 84 %ile High Average   Trial 4  ss = 13.0 84 %ile High Average   Trial 5  ss = 15.0 95 %ile Above Average   Trial B  ss = 17.0 99 %ile Exceptionally High   Short Delay Free Recall  ss = 13.0 84 %ile High Average   Short Delay Cued Recall  ss = 10.0 50 %ile Average   Long Delay Free Recall  ss = 10.0 50 %ile Average   Long Delay Cued Recall  ss = 9.0 37 %ile Average   Total Hits  ss = 7.0 16 %ile Low Average   Total False Positives  ss = 12.0 75 %ile High Average   Recognition Discriminability  ss = 10.0 50 %ile Average   Total Intrusions  ss = 9.0 37 %ile Average   Trials 1-5 Total Correct  SS = 124 95 %ile Above Average   Total Repetitions  ss = 8.0 25 %ile Average   List B vs. Trial 1  ss = 16.0 98 %ile Exceptionally High   SD (FR) vs. Trial 5 Correct  ss = 9.0 37 %ile Average   LD (FR)vs. SD (FR)  ss = 6.0 9 %ile Low Average  Wechsler Memory Scale, 4th Edition (WMS-4)         Log. Mem. Immediate Recall  ss = 12 75 %ile High Average   Logical Memory Delayed Recall  ss = 10 50 %ile Average   Logical Recognition    26th-50th  %ile Average   VISUAL-SPATIAL    Norm Score Percentile  Range  WASI-II          Block Design  t = 62 88 %ile High Average   Matrix Reasoning  t = 54 66 %ile Average  Benton JOLO                    9 %ile Low Average  Rey Complex Figure Copy       >16 %ile WNL   PERSONALITY AND BEHAVIORAL FUNCTIONING      Score/Interpretation  BDI Raw       15  BDI Severity       Mild.  BAI Raw       15  BAI Severity `      Mild.   Feedback to Patient: Courtney Peck will return on 01/20/2024 for an interactive feedback session with Dr. Hayden at which time her test performances, clinical impressions and treatment recommendations will be reviewed in detail. The  patient understands she can contact our office should she require our assistance before this time.  190 minutes spent face-to-face with patient administering standardized tests, 30 minutes spent scoring Radiographer, therapeutic). [CPT [id number], [id number]]  Full report to follow.

## 2024-01-14 ENCOUNTER — Encounter: Admitting: Psychology

## 2024-01-14 DIAGNOSIS — F809 Developmental disorder of speech and language, unspecified: Secondary | ICD-10-CM | POA: Diagnosis not present

## 2024-01-14 DIAGNOSIS — R4189 Other symptoms and signs involving cognitive functions and awareness: Secondary | ICD-10-CM | POA: Diagnosis not present

## 2024-01-20 ENCOUNTER — Encounter: Admitting: Psychology

## 2024-01-20 DIAGNOSIS — R4189 Other symptoms and signs involving cognitive functions and awareness: Secondary | ICD-10-CM

## 2024-01-20 NOTE — Progress Notes (Signed)
 NEUROPSYCHOLOGICAL EVALUATION [name]  Physical Medicine and Rehabilitation     Patient: Courtney Peck  MRN: [id number] DOB: [birth year]  Age: 28 y.o. Sex: female  Race/Ethnicity: White or Caucasian  Years of Education: 18  Referring Provider: Margaret Eduard SAUNDERS, MD  Provider/Clinical Neuropsychologist: Evalene DOROTHA Riff, PsyD  Date of Service: 01/14/24 Start Time: 10 AM End Time: 11 AM  Location of Service:  [facility] Physical Medicine & Rehabilitation Department [place]. [facility] [address] [place] [place], [place] [address] Phone: [phone]  Billing Code/Service: [phone]  Individuals Present: Evalene Riff, PsyD 1 hour was spent on interpretation of patient data, interpretation of standardized test results and clinical data, clinical decision making, initial treatment planning/recommendations, and report writing. The report will be amended as needed based on any additional information collected during interactive feedback session.   REASON FOR REFERRAL:The patient was referred for neuropsychological evaluation by her neurologist, Dr. Margaret, due to cognitive changes involving language functioning. HPI from the patient's initial visit with the referring provider (10/24/2023) indicated that 05/10/2021-05/12/2021 the patient had 4 episodes of shortness of breath, shakiness, weakness and confusion. In one of these episodes the patient passed out. She was under increased stress and sleep deprivation around that time. Neurology evaluation including MRI and EEG which were unremarkable.  Symptoms were felt to be related to stress and insomnia, and a form of nonepileptic spell.  No recurrent spell since that time. 10/24/23 neurology visit records noted patient reports of generalized word finding, language, and motor problems since that time but which have gradually improved. Records state that the patient reported feeling 90% back to  baseline in motor skills but 70% with language abilities. Language issues are reported to be problematic for her as she works towards completion of her masters degree. Records also note a history of headaches with migraine features occurring once every few months.   Upon interview, the patient indicated she was pursuing the evaluation out of concern that her symptoms may be neurologically based and connected to the seizure-like episodes in 2023.   HISTORY OF PRESENTING CONCERNS: Per interview; Cognitive Symptom Onset & Course: The patient reported sudden onset of cognitive difficulties the day following the January 2023 episodes. She indicated that symptoms initially, rapidly worsened, and then slowly improved over the course of six months before leveling out and remaining at current levels. No clear precipitating events were identified. No clear exacerbating or ameliorating factors were identified.     Current Cognitive Complaints:  Memory: The patient described mild difficulties with short-term memory primarily limited to occasional difficulty remembering content of conversations. 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   Processing Speed: The patient indicated that she feels her processing speed is slowed relative to her historical baseline. She described indications of reduced efficiency related to losing train of thought and her mind halting or going blank.  Attention & Concentration: The patient described a worsening of minor premorbid tendencies towards distraction from extraneous environmental stimuli, but also broader decline in attention. She described indications of frequently losing her train of thought, interruptions from her mind going blank, difficulty with sustaining attention overall, and indications of attentional lapses. She also  described indications of attention related problems in reading, needing to reread something several times to retain/comprehend it.  Language: The patient described declines and in word-finding.  She described difficulties with phonemic paraphasic errors in speech and in her writing (utilized xray rather than stingray throughout a paper about the latter). She also reported that in conversation, her sentences are sometimes unintentionally incomplete, failing to include the statement in her mind in its entirety when speaking. The patient described making word order errors in her writing as well. She indicated that it is difficult for her to notice the errors but that they are apparent to the people reading them.  Visual-Spatial: The patient is able to draw basic stimuli without notable difficulty. She described difficulties with depth perception, possibly related to motor control. She indicated she always parks her car by backing into a parking space (no rear camera used) and is able to do this without difficulty.   Executive Functioning: The described slight decreases in problem efficiency that appear linked to attention and working memory difficulties. She denied frank impairment in problem solving, and has not noticed changes in her decision making, planning, organization, or inhibitory control. She denied any broad based changes in personality or behavior overall.   Motor/Sensory Complaints:   Sensory changes: The patient denied any changes in her sense of smell, taste, hearing, or vision. Balance/coordination difficulties: The patient described some mild lifelong difficulties with balance.  She indicated that she had some difficulty that was more pronounced and impacting her walking immediately following the seizure episodes, but which resolved after a week.  Patient described slight declines in her handwriting.  She indicated that she tends to bump into things a lot when ambulating and occasionally trips.   Descriptions of her performance in first-person shooter games suggests intact fine motor control and hand eye coordination in general. Frequent instances of dizziness/vertigo: The patient described experiences of vertigo/dizziness several times a week without clear cause. Other motor difficulties: Denied any history of tremor.  Emotional and Behavioral Functioning:  History: The patient has a history of depression, anxiety, and suicidality starting around the age of 84. Of note, she has experienced multiple traumatic events throughout her life, starting at 28 years of age. She has a history of 11 suicide attempts (overdose/poisoning) between 2012 and 2017. She received emergency medical care in some of these instances. She has a history of inpatient psychiatric admission (estimates 13). She has a history of mental health treatment involving medication and individual therapy. She reported that she was in individual therapy starting with an acute stressor in late 2023 and she continued until that provider left the practice in spring of this year. The patient indicated that she has done work on trauma within individual therapy. The last instance of suicidal ideation was in 2023.   The patient indicated that she does not feel she is currently in a depressive episode. She denied feelings of frequent sadness, anhedonia, reduced motivation, reduced energy, and denied any current suicidal ideation. She described indications of some trait-like anxiety, which is improved relative to earlier in life. She indicated that anxiety tends to be more prominent in social settings. She denied experiencing generalized worry, difficulty relaxing, feelings of tension, and has not had a panic attack since 2023. The patient reported some difficulty surrounding sleep, that is connected to previous trauma, as the primary trauma related difficulty she experiences at present. She described some minor checking behaviors, but limited  indication of OCD symptomology overall.    The patient denied any current or past homicidal ideation, hallucination, paranoia, or clear indications of mania or other psychosis.   Sleep: Sleeps around 7-8 hours each  night. Started hydroxyzine approximately one month ago and this has improved sleep onset. She indicated she usually feels rested in the morning. No history of sleep apnea. No frequent nightmares.   Appetite: Current appetite described as good. History of difficulty with eating behavior around the age of 58.  Caffeine: About one cup of coffee per day.   Alcohol Use: Minimal; Socially, 1 drink per setting,  Tobacco Use: None Recreational Substance Use: None    Level of Functional Independence: The patient is intact with basic and instrumental activities of daily living.   Medical History/Record Review: The patient indicated that she has follow-up visits planned with cardiology for evaluation of possible VST due to experience of check pains/shortness of breath occurring twice a month. This has been present for at least one year.  History of traumatic brain injury/concussion:Possible concussion around 18. Denied persistent changes.   History of stroke: No History of heart attack: No History of cancer/chemotherapy: No History of seizure activity: None other than reported above. Symptoms of chronic pain: None Experience of frequent headaches/migraines: Describes experiencing minor headaches approximately twice per week.  She does experience migraines on average once every 2 months.  She describes some aura related symptoms.  Duration can last between 15 minutes to 2 days but she estimates average duration is 2 hours.  Past Medical History:  Diagnosis Date   Anemia    Bleach ingestion    high school   Gallstone    Overdose    high school   Tachycardia    history of 2016, has seen cardiologist   Patient Active Problem List   Diagnosis Date Noted   Overdose 05/09/2021    Imaging/Lab Results:  07/26/2021 MR BRAIN WO CONTRAST FINDINGS: Brain: Cerebral volume is normal. No cortical encephalomalacia is identified. No significant cerebral white matter disease. The hippocampi are symmetric in size and signal. There is no acute infarct. No evidence of an intracranial mass. No chronic intracranial blood products. No extra-axial fluid collection. No midline shift. Vascular: Maintained flow voids within the proximal large arterial vessels. Skull and upper cervical spine: No focal suspicious marrow lesion. Sinuses/Orbits: Visualized orbits show no acute finding. Minimalmucosal thickening within the anterior right ethmoid air cells. Mild mucosal thickening within the right maxillary sinus. IMPRESSION: Unremarkable non-contrast MRI appearance of the brain. No evidence of acute intracranial abnormality. No specific seizure focus is identified. Mild right ethmoid and maxillary sinus disease, as described.  07/19/2021 EEG - Interpretation  This is a normal awake and asleep EEG  - Clinical correlation  A normal EEG does not rule out a seizure  disorder.  Clinical correlation is required.   Family Neurologic/Medical Hx:  Family History  Problem Relation Age of Onset   Cancer Mother    Seizures Maternal Grandmother    Leukemia Paternal Grandfather    Medications:  Melatonin Hydroxyzine (recently increased from 10 mg) Iron Multivitamin rizatriptan  (MAXALT ) 10 MG tablet -  TRI-LO-MARZIA 0.18/0.215/0.25 MG-25 MCG tab  Academic/Vocational History: Denied any learning difficulties growing up.  Denied any problems with attention and concentration.  Performed well in school.  Currently attends [employer], recently earned masters, and is working towards PhD in psychology. She indicated that she is performing well overall.   Psychosocial: Marital Status: In a relationship.  Children/Grandchildren: None Living Situation: With partner. Daily Activities/Hobbies:  Enjoys playing video games Sindy)  NEUROPSYCHODIAGNOSTIC FINDINGS:  Mental Status/Behavioral Observations (01/12/2024):  Sensory/Arousal: The patient was alert. Hearing and vision were adequate for the demands of testing.  Appearance: Dress and hygiene were appropriate for the setting.  Speech/Language: Conversational speech was prosodic, fluent, and well-articulated. No significant paraphasic errors or indications of word-finding difficulties were noted in casual conversation. Motor: The patient ambulated independently and without issue. No tremors were observed.  Social Comportment: Social behavior was appropriate to the setting Mood/Affect: Mood was largely neutral. Affect was consistent with mood.  Attention/Concentration:  The patient participated in testing as instructed. No frank attentional lapses were observed.  Thought Process/Content: The patient's thought process was coherent, linear, goal directed. There were no indications of psychosis.  Ability to Participate in Testing / Additional Observations: The patient appeared engaged throughout testing. She occasionally asked clarifying questions if uncertain about task instructions.  Tests Administered: Brief Diagnostic Aphasia Examination- Third Edition (BDAE); select subtests Brief Visuospatial Memory Test-Revised (BVMT-R) Benton Judgment of Line Orientation (JOLO) [place]  Verbal Learning Test-Third Edition (CVLT-3) Controlled Oral Word Association Test (FAS & Animals) Delis-Kaplan Executive Function System (D-KEFS), select subtests Finger Tapping Test (FTT) Neuropsychological Assessment Battery (NAB), select subtest Rey Complex Figure Test (RCFT), select subtests Wechsler Adult Intelligence Scale-Fourth Edition (WAIS-IV), select subtests Wechsler Abbreviated Scale of Intelligence CIVIL SERVICE FAST STREAMER) Wechsler Memory Scale-Fourth Edition (WMS-IV) , select subtests Wechsler Memory Scale-Third Edition (WMS-III), select subtests  The  Beck Depression Inventory-II (BDI-II) Beck Anxiety Inventory (BAI)  Performance Validity: Behaviorally, no clear indications of sub-optimal engagement were noted during the testing session. The patient completed several measures with embedded performance validity metrics. Validity scores were consistently within normal limits. Behavioral observations and performance validity scores support the interpretation of test data as a reliable and valid estimate of the patient's current cognitive functioning.    Test Results:   INTELLECTUAL FUNCTIONING/IQ ESTIMATES   Norm Score Percentile  Range  WASI-II FSIQ-4  SS = 116 86 %ile High Average   Vocabulary  43 t = 57 75 %ile High Average   Matrix Reasoning  23 t = 54 66 %ile Average   Block Design 60 t = 62 88 %ile High Average   Similarities 38 t = 62 88 %ile High Average  The patient's composite score on a measure estimating overall intellectual abilities was within the high average range. Scores were consistent within and between verbal and nonverbal measures (PRI SS = 113, 81%ile; VCI SS = 115, 84%ile).   ATTENTION AND WORKING MEMORY    Norm Score Percentile  Range  WAIS-IV          Digit Span  ss = 11 63 %ile Average   DSF 11 ss = 10 50 %ile Average   Span:    7      DSB 12 ss = 15 95 %ile Above Average   Span:    6      DSS 8 ss = 9 37 %ile Average   Span:    6      Arithmetic 15 ss = 11 63 %ile Average  WMS-III          Spatial Span  ss = 13 84 %ile High Average   SSF 10 ss = 11 63 %ile Average   Span:    7      SSB 11 ss = 15 95 %ile Above Average   Span:    7     The patient's performance on an auditory-verbal test of basic attention was scored in the average range overall.  Her score on the subtest assessing basic span (DSF) was average.  There is some variability between subtests with greater  demands on working memory/mental manipulation. When required to restate digits in reverse order, relative to presentation, she scored within the  above average range. She scored in the average range on the subtest with a sequencing element, one requiring her to restate digits in order of value from least to greatest. The patient scored within the average range on a mental arithmetic measure   Patient's four on a visual attention test was scored in the high average range.  There was slight variability on this measure as well with subtest performances ranging from average to above average. (Note: subtests on this test do not differ significantly with respect to working memory).   PROCESSING SPEED    Norm Score Percentile Range  WAIS-IV          Coding 89 ss = 13 84 %ile High Average   Symbol Search 30 ss = 9 37 %ile Average  The patient completed to measures of processing speed. She scored within the high average range by age on a rapid digit-symbol translation task. Her performance on a speeded visual discrimination task was average by age, an slightly lower than the digit-symbol task.   PSYCHOMOTION    Norm Score Percentile  Range  Finger Tapping Test - Dominant 48.6 t = 50 50 %ile Average  Finger Tapping Test - Nondominant 48.8 t = 56 73 %ile Average  Pure motor speed was intact, bilaterally. Visual scanning speed (DKEFS - Trial 1) was average by age. Gross motor speed (DKEFS - Trial 5) was high average by age.   LANGUAGE    Norm Score Percentile  Range  NAB Naming 29 t = 41 18 %ile Low Average  COWAT          FAS 46 t = 46 32 %ile Average   Animals 19 t = 35 7 %ile Below Average  BDAE          Picture Description       (See below)   Sentence Repetition 8/10      (See below)  The patient's score on a confrontation-naming task was technically low average relative to individual her age with 16+ years of education. However, the two items she did not earn points for are items which, based on responses elicited via cueing, appear to be items she is unfamiliar with. Her performance would be within normal limits if these items were excluded.  The patient's score on a measure of phonemic verbal fluency was scored in the average range by age and education.  She had more difficulty on a semantic verbal fluency task with which she scored in the below average range by age and education.  The patient completed two subtests from the BDAE. Due to administration error, the Picture Description subtest (Cookie Theft) was administered twice, but this provided additional data for qualitative analysis. There was one phonemic paraphasic error noted across both verbal responses, but no other paraphasic errors or indications of deficits or dysfunction in verbal expressive language was noted. The patient completed a basic sentence repetition task. Two morphosyntactic errors were noted within one sentence repetition item, and a pronoun case substitution error was noted in the other.   As noted, the patient performed well on measures assessing verbal abilities within the abbreviated IQ measure. Specifically, she scored within the high average range on a measure of crystallized word knowledge and her performance on an abstract verbal reasoning task was also high average.  Verbatim responses recorded by the technician for responses on verbal measures  from the IQ measure showed no clear indications of syntactic errors and no paraphasic errors were noted.   EXECUTIVE FUNCTIONING    Norm Score Percentile  Range   DKEFS - Verbal Fluency          Category Switching 13 ss = 9 37 %ile Average   Switching Accuracy 13 ss = 11 63 %ile Average  The patient's performance on a semantic verbal fluency task with a set shifting element was average for total words generated and accuracy.  DKEFS - Color-Word Interference          Color Naming 33 ss = 7 16 %ile Low Average   Word Reading 26 ss = 8 25 %ile Average   Inhibition 41 ss = 12 75 %ile High Average   Errors 0 ss = 12 75 %ile High Average   Inhibition Switching 55 ss = 10 50 %ile Average   Errors 1 ss = 11 63 %ile  Average   (Inhibition Switching vs. Inhibition)  ss = 8 25 %ile Average   (Inhibition vs. Color Naming)  ss = 15 95 %ile Above Average   (Inhibition Switching vs. CN+WR)  ss = 14 63 %ile Above Average  DKEFS - Trails          Condition I 24 ss = 9 37 %ile Average   Condition II 19 ss = 13 84 %ile High Average   Condition III 18 ss = 13 84 %ile High Average   Condition IV 51 ss = 12 75 %ile High Average   Condition V 17 ss = 13 84 %ile High Average  The patient's speed based scores on baseline metrics were scored in the low average range for rapid color naming and average for rapid color-word reading.  By age, she scored within the high average range in speed and accuracy on the basic response inhibition task.  Her speed and accuracy were both average by age when response inhibition was combined with set shifting.  Her contrast score between the combination task and the inhibition only task was average showing typical relative declines.  Her performances on both inhibition and inhibition switching, when compared to baseline metrics, were above average showing considerably less decline than his norm. It is possible baseline metrics may be underestimates.   The patient's number and letter sequencing speeds were both high average by age.  Sequencing combined with set shifting was scored in the high average range as well.  MEMORY    Norm Score Percentile  Range  BVMT-R          Trial 1 8 t = 54.0 66 %ile Average   Trial 2 11 t = 57 75 %ile High Average   Trial 3 12 t = 59.0 81 %ile High Average   Total Recall  t = 57.0 75 %ile High Average   Learning 4 t = 52.0 58 %ile Average   Delayed Recall 12 t = 60.0 84 %ile High Average   % Retained    100 >16 %ile WNL   Hits 6    >16 %ile WNL   False Alarms 0    >16 %ile WNL   Recognition Discriminability 6    >16 %ile WNL  CVLT-III          Trial 1 8 ss = 13.0 84 %ile High Average   Trial 2 14 ss = 16.0 98 %ile Exceptionally High   Trial 3 14 ss =  13.0 84 %ile High Average  Trial 4 14 ss = 13.0 84 %ile High Average   Trial 5 16 ss = 15.0 95 %ile Above Average   Trial B 12 ss = 17.0 99 %ile Exceptionally High   Short Delay Free Recall 14 ss = 13.0 84 %ile High Average   Short Delay Cued Recall 12 ss = 10.0 50 %ile Average   Long Delay Free Recall 12 ss = 10.0 50 %ile Average   Long Delay Cued Recall 12 ss = 9.0 37 %ile Average   Total Hits 14 ss = 7.0 16 %ile Low Average   Total False Positives 0 ss = 12.0 75 %ile High Average   Recognition Discriminability  ss = 10.0 50 %ile Average   Total Intrusions 4 ss = 9.0 37 %ile Average   Trials 1-5 Total Correct  SS = 124 95 %ile Above Average   Total Repetitions 9 ss = 8.0 25 %ile Average   List B vs. Trial 1  ss = 16.0 98 %ile Exceptionally High   SD (FR) vs. Trial 5 Correct  ss = 9.0 37 %ile Average   LD (FR)vs. SD (FR)  ss = 6.0 9 %ile Low Average  Wechsler Memory Scale, 4th Edition (WMS-4)         Log. Mem. Immediate Recall 30 ss = 12 75 %ile High Average   Logical Memory Delayed Recall 24 ss = 10 50 %ile Average   Logical Recognition 24   26th-50th  %ile Average  The patient completed one visually based memory test and to auditory-verbal memory test.  On the visual base memory test the patient scored in the average to high average range across the three learning trials.  She was high average with respect to total information encoded hide across those trials.  Her relative gains from repetition (learning score) was average.  Her delayed free recall was high average and she scored within normal limits for percent retention (100%).  She made no errors on the delayed recognition task.  The patient's performance on a auditory-verbal memory test involving a word list showed above average immediate recall across the five learning trials. Her encoding of an interference list was considerably higher than her initial encoding on the first learning trial.  Following a short delay, her delayed free  recall was high average.  Unexpectedly, her delayed cued recall was slightly lower and within the average range.  She showed no indications of significant loss over time, scoring average in free and cued recall following a long delay.  She technically scored low average with recognition of true positives, although this appeared quite consistent with her (qualitative) initial encoding and she made no false positive errors.   On a prose memory test, the patient's immediate recall following a single presentation of two short stories was scored in the high average range.  Her delayed free recall and delayed recognition of story details was scored in the average range.  VISUAL-SPATIAL    Norm Score Percentile  Range  WASI-II          Block Design  t = 62 88 %ile High Average   Matrix Reasoning  t = 55 66 %ile Average  [place] JOLO 20                   9 %ile Low Average  [facility] Figure Copy 36      >16 %ile WNL  The patient's performance was within the high average range on a visual construction task  requiring her to replicate a two-dimensional figure using three-dimensional blocks under timed conditions.  She scored within the average range on an abstract nonverbal reasoning task.  She scored in the low average range on a visual perception task involving judgment of Line orientation.  Examination of her responses showed no pattern suggesting field based deficits.  Her score on a figure copy task was scored within normal limits.   PERSONALITY AND BEHAVIORAL FUNCTIONING      Score/Interpretation  BDI Raw       15  BDI Severity       Mild.  BAI Raw       15  BAI Severity `      Mild.  Self-report endorsements on rating scales of depression and anxiety generated total scores within the mild severity range within both domains.  Test scores are relative to age, gender, and educational history as available and appropriate. Measurement properties of test scores: IQ, Index, and Standard Scores (SS): Mean =  100; Standard Deviation = 15; Scaled Scores (ss): Mean = 10; Standard Deviation = 3; Z scores (Z): Mean = 0; Standard Deviation = 1; T scores (T); Mean = 50; Standard Deviation = 10  SUMMARY / CLINICAL IMPRESSIONS The patient was referred for neuropsychological evaluation by her neurologist, Dr. Margaret, due to cognitive changes involving language functioning. HPI from the patient's initial visit with the referring provider (10/24/2023) indicated that 05/10/2021-05/12/2021 the patient had 4 episodes of shortness of breath, shakiness, weakness and confusion... neurology evaluation including MRI and EEG which were unremarkable.  Symptoms were felt to be related to stress and insomnia, and a form of nonepileptic spell.  No recurrent spell since that time. Visit notes indicate that the patient had generalized word finding, language, and motor problems since that time but which have gradually improved. Records state that the patient reported feeling 90% back to baseline in motor skills but 70% with language abilities. Language issues are reported to be problematic for her as she works towards completion of her masters degree. Records also note a history of headaches with migraine features occurring once every few months.   Upon interview, the patient indicated she was pursuing the evaluation out of concern that her symptoms may be neurologically based and connected to the seizure-like episodes in 2023.  The patient described difficulties primarily with expressive language, particularly with word-finding and phonemic paraphasic errors in both speech and writing.  There is some increased difficulty with reading although this appears to be related to attentional difficulties.  Difficulties with attention involve losing train of thought, possible signs of difficulties in working memory, and sustained attention. The patient described changes in processing speed but also possible indications of her mind going blank,  something often accompanying anxiety. Difficulties with short term memory are relatively minimal, and suspicious for difficulties primarily caused by attention problems. No clear difficulties in visual-spatial abilities or executive functioning. The patient has a significant psychiatric history including history of trauma. She described minimal difficulties at present, however. A formal neuropsychological evaluation can help delineate the patient cognitive profile to assess for any indications of impairment. The provider discussed the patient's experience with cognitive testing within her graduate program to inform test selection and to account for possible impact of familiarity with some test formats.   Performance validity metrics were within normal limits, supporting the validity of testing from an engagement perspective. There are some validity considerations warranting mention, specifically the patient's experience in administration of cognitive testing through her graduate  training. The patient's familiarity with test content involves measures developed for child and adolescent populations (WISC-V etc.). The content of the Weschler tests utilized in the current evaluation is unfamiliar to the patient (Utilized WASI-II), and primary threats to validity involve more-so familiarity with format more so than knowing the answers. This cannot be controlled for entirely in the case of the IQ measures and the WAIS-IV measures of basic attention and processing speed. That being said, it does not invalidate the test results as a whole, but some caution is warranted in interpretation of WAIS-IV subtests and WASI-II as impact of prior exposure cannot be fully controlled for. Overall, the patient showed strong cognitive abilities and no compelling evidence of cognitive impairment. Overall intellectual abilities aligned with expectations based on the patient's academic history. Performances on measures of basic attention  and working memory (auditory-verbal and visual) were adequately in-line with expectations as were measures of processing speed. Speeded motor dexterity was intact, as were motor performances on baseline D-KEFS trails. Language performances are reassuring overall. Semantic verbal fluency was a bit low, but normed by age plus education, and there were no indications of problems involving semantics on other metrics. She had no difficulty on the executive measuring involving semantic verbal fluency and set shifting. Her performance on confrontation naming/word-retrieval was technically low average (by age+education), but the only two items she missed were relatively uncommon/unfamiliar items among younger adults. The absence of word finding difficulties elsewhere or in conversation further supports this. There were a couple of errors on the BDAE but no compelling indications for neurologic based language deficits when examined closely and within the context of all other data. Memory performances and performances on measures of executive functioning were consistent with expectations. Overall, the patient's cognitive abilities are quite strong and there are no compelling indications of impairment within any domain.   Diagnosis: No formal cognitive diagnosis   Recommendations:  No formal cognitive diagnosis is indicated.  Cognitive data can serve as a baseline should the patient need testing in the future for any reason.  Please don't hesitate to reach out with any questions upon receipt of this report.           Evalene DOROTHA Riff, PsyD             Neuropsychologist   This report was generated using voice recognition software. While this document has been carefully reviewed, transcription errors may be present. I apologize in advance for any inconvenience. Please contact me if further clarification is needed.

## 2024-03-01 NOTE — Progress Notes (Signed)
   NEUROPSYCHOLOGICAL EVALUATION [place]  Physical Medicine and Rehabilitation     Patient: Courtney Peck  MRN: [id number] DOB: 18-Feb-1996   Service Provider/Clinical Neuropsychologist: Evalene DOROTHA Riff, PsyD  Date of Service: 01/20/24 Start Time: 1 PM End Time: 2 PM  Location of Service:  [facility] Physical Medicine & Rehabilitation Department [place] [address] [place] [place], [place] [address] Phone: [phone]   Billing Code/Service: [phone]    Individuals present: Patient, Provider Laurier DOROTHA Riff, PsyD)  Provider conducted the 60-minute interactive feedback appointment in-person with the patient.  The provider reviewed and discussed the results of neuropsychological evaluation. Follow-up interviewing was conducted as needed to refine interpretation of findings as needed. Review of results included overall findings, diagnosis, and treatment planning/recommendations that were derived from integration of patient data, interpretation of standardized rest results and clinical data, and clinical decision making, which are documented in the patient's electronic medical record with the full report (date listed below). A copy of the full report will also be mailed to the patient.   The patient expressed understanding of the information reviewed. The patient was provided opportunity to ask questions which were then answered by the provider. The provider worked collaboratively to tailor treatment recommendations to the patient when possible. The patient was informed they could reach out to the provider should additional questions related to the evaluation arise.    The final neuropsychological evaluation report, documented in the patient's chart on (DATE 01/14/24), was amended to reflect any additional information obtained during the feedback appointment including treatment planning collaboration.    This report was generated using  voice recognition software. While this document has been carefully reviewed, transcription errors may be present. I apologize in advance for any inconvenience. Please contact me if further clarification is needed.             Evalene DOROTHA Riff, PsyD             Neuropsychologist

## 2024-04-06 ENCOUNTER — Ambulatory Visit: Payer: Self-pay

## 2024-04-06 NOTE — Telephone Encounter (Signed)
 FYI Only or Action Required?: FYI only for provider: appointment scheduled on 05/20/2024 at 11:00 AM.  Scheduled for new patient appointment  Called Nurse Triage reporting Vaginal Bleeding (Spotting per patient).  Symptoms began today.  Interventions attempted: Nothing.  Symptoms are: stable.  Triage Disposition: Home Care  Patient/caregiver understands and will follow disposition?: Yes  Copied from CRM #[id number]. Topic: Clinical - Red Word Triage >> Apr 06, 2024 10:32 AM China J wrote: [facility] that prompted transfer to Nurse Triage: Patient called in to establish with a PCP. She mentioned that she was having vaginal bleeding while working out along with abdominal pain and nausea. She said that this happened outside her normal cycle. Reason for Disposition  [1] Bleeding or spotting between regular periods AND [2] occurs three cycles (3 months) or less this past year  Answer Assessment - Initial Assessment Questions Patient reports vaginal bleeding that is spotting that started today. Reports abdominal discomfort that is 2-3 out of 10 in severity. Patient scheduled for new patient appointment in Jan 2026. Patient given education on spotting and what symptoms to look out for if symptom continues. ED precautions given.   1. BLEEDING SEVERITY: Describe the bleeding that you are having. How much bleeding is there?      spotting 2. ONSET: When did the bleeding begin? Is it continuing now?     Happened today in the morning-continuing to spot 3. MENSTRUAL PERIOD: When was the last normal menstrual period? How is this different than your period?     Almost a month ago 4. REGULARITY: How regular are your periods?     Patient states periods are generally regular but is on birth control and has a period every three months 5. ABDOMEN PAIN: Do you have any pain? How bad is the pain?  (e.g., Scale 0-10; none, mild, moderate, or severe)     2-3 out of 10 6. PREGNANCY: Is there  any chance you are pregnant? When was your last menstrual period?     No concerns for pregnancy-period was a month ago 7. BREASTFEEDING: Are you breastfeeding?     no 8. HORMONE MEDICINES: Are you taking any hormone medicines, prescription or over-the-counter? (e.g., birth control pills, estrogen)     Oral birth control 9. BLOOD THINNER MEDICINES: Do you take any blood thinners? (e.g., Coumadin / warfarin, Pradaxa / dabigatran, aspirin)     no 10. CAUSE: What do you think is causing the bleeding? (e.g., recent gyn surgery, recent gyn procedure; known bleeding disorder, cervical cancer, polycystic ovarian disease, fibroids)         unsure 11. HEMODYNAMIC STATUS: Are you weak or feeling lightheaded? If Yes, ask: Can you stand and walk normally?        Having some weakness-is able to stand and walk normally.  12. OTHER SYMPTOMS: What other symptoms are you having with the bleeding? (e.g., passed tissue, vaginal discharge, fever, menstrual-type cramps)       no  Protocols used: Vaginal Bleeding - Abnormal-A-AH

## 2024-04-16 DIAGNOSIS — Z419 Encounter for procedure for purposes other than remedying health state, unspecified: Secondary | ICD-10-CM | POA: Diagnosis not present

## 2024-05-20 ENCOUNTER — Ambulatory Visit: Attending: Family Medicine

## 2024-05-20 ENCOUNTER — Ambulatory Visit: Payer: Self-pay | Admitting: Family Medicine

## 2024-05-20 ENCOUNTER — Ambulatory Visit: Admitting: Family Medicine

## 2024-05-20 ENCOUNTER — Encounter: Payer: Self-pay | Admitting: Family Medicine

## 2024-05-20 VITALS — BP 122/72 | HR 100 | Temp 98.2°F | Ht 65.0 in | Wt 162.0 lb

## 2024-05-20 DIAGNOSIS — Z7689 Persons encountering health services in other specified circumstances: Secondary | ICD-10-CM

## 2024-05-20 DIAGNOSIS — Z30011 Encounter for initial prescription of contraceptive pills: Secondary | ICD-10-CM

## 2024-05-20 DIAGNOSIS — R079 Chest pain, unspecified: Secondary | ICD-10-CM

## 2024-05-20 DIAGNOSIS — G4700 Insomnia, unspecified: Secondary | ICD-10-CM

## 2024-05-20 DIAGNOSIS — Z23 Encounter for immunization: Secondary | ICD-10-CM

## 2024-05-20 DIAGNOSIS — R002 Palpitations: Secondary | ICD-10-CM | POA: Diagnosis not present

## 2024-05-20 DIAGNOSIS — E538 Deficiency of other specified B group vitamins: Secondary | ICD-10-CM

## 2024-05-20 DIAGNOSIS — E663 Overweight: Secondary | ICD-10-CM | POA: Diagnosis not present

## 2024-05-20 DIAGNOSIS — Z6826 Body mass index (BMI) 26.0-26.9, adult: Secondary | ICD-10-CM

## 2024-05-20 LAB — CBC WITH DIFFERENTIAL/PLATELET
Basophils Absolute: 0 K/uL (ref 0.0–0.1)
Basophils Relative: 0.3 % (ref 0.0–3.0)
Eosinophils Absolute: 0.1 K/uL (ref 0.0–0.7)
Eosinophils Relative: 0.5 % (ref 0.0–5.0)
HCT: 40.7 % (ref 36.0–46.0)
Hemoglobin: 13.6 g/dL (ref 12.0–15.0)
Lymphocytes Relative: 32.6 % (ref 12.0–46.0)
Lymphs Abs: 3.9 K/uL (ref 0.7–4.0)
MCHC: 33.4 g/dL (ref 30.0–36.0)
MCV: 83.7 fl (ref 78.0–100.0)
Monocytes Absolute: 0.5 K/uL (ref 0.1–1.0)
Monocytes Relative: 4.5 % (ref 3.0–12.0)
Neutro Abs: 7.4 K/uL (ref 1.4–7.7)
Neutrophils Relative %: 62.1 % (ref 43.0–77.0)
Platelets: 286 K/uL (ref 150.0–400.0)
RBC: 4.86 Mil/uL (ref 3.87–5.11)
RDW: 13.2 % (ref 11.5–15.5)
WBC: 11.9 K/uL — ABNORMAL HIGH (ref 4.0–10.5)

## 2024-05-20 LAB — COMPREHENSIVE METABOLIC PANEL WITH GFR
ALT: 28 U/L (ref 3–35)
AST: 29 U/L (ref 5–37)
Albumin: 4.4 g/dL (ref 3.5–5.2)
Alkaline Phosphatase: 50 U/L (ref 39–117)
BUN: 12 mg/dL (ref 6–23)
CO2: 25 meq/L (ref 19–32)
Calcium: 9 mg/dL (ref 8.4–10.5)
Chloride: 102 meq/L (ref 96–112)
Creatinine, Ser: 0.71 mg/dL (ref 0.40–1.20)
GFR: 116.38 mL/min
Glucose, Bld: 84 mg/dL (ref 70–99)
Potassium: 3.7 meq/L (ref 3.5–5.1)
Sodium: 137 meq/L (ref 135–145)
Total Bilirubin: 0.4 mg/dL (ref 0.2–1.2)
Total Protein: 7.7 g/dL (ref 6.0–8.3)

## 2024-05-20 LAB — VITAMIN D 25 HYDROXY (VIT D DEFICIENCY, FRACTURES): VITD: 25.95 ng/mL — ABNORMAL LOW (ref 30.00–100.00)

## 2024-05-20 LAB — HEMOGLOBIN A1C: Hgb A1c MFr Bld: 5.2 % (ref 4.6–6.5)

## 2024-05-20 LAB — VITAMIN B12: Vitamin B-12: 374 pg/mL (ref 211–911)

## 2024-05-20 LAB — TSH: TSH: 0.57 u[IU]/mL (ref 0.35–5.50)

## 2024-05-20 LAB — MAGNESIUM: Magnesium: 2 mg/dL (ref 1.5–2.5)

## 2024-05-20 NOTE — Progress Notes (Signed)
 "  New Patient Office Visit  Subjective   Patient ID: GEM CONKLE, female    DOB: October 16, 1995  Age: 29 y.o. MRN: [id number]  CC:  Chief Complaint  Patient presents with   Establish Care    HPI DANAIJA ESKRIDGE presents to establish care with new provider.  Patients previous primary care provider: [facility] with Bascom Finder. Last seen December 2025.   Specialist: Previously seen [facility] with Dr. Eduard Hanlon for migraine with aura and language difficulty. Last seen on 10/24/2023. She was suppose to follow up in 4 months, but didn't follow up. She was prescribed Rizatriptan . She reports she   Insomnia: Chronic. Patient is taking Hydroxyzine 25mg  as needed. Usually takes about 2-4 times a week. She reports she took another unknown insomnia medication that caused her complications.   Birth Control: Chronic. Patient is taking Tri-Lo-Marzia tablet daily.   She is complaining of intermittent chest pain. Mid to left side. Starts all of a sudden and will continue gradually. She reports she has some SHOB, lightheaded, and palpitations. This occurs random, last occurred 2 days ago. Sometimes once a month up to 3 times a month. Pain usually last about an average of 30 minutes. Last time she had this pain 2 days ago, she was able to continue to cook dinner. Pain radiates to her left arm. She reports she will sometimes have a smell sensation of chlorine when having chest pain. She reports she has had chest pain before and told it was related anxiety.  Patient has a psych history and would like a psychiatry and psychologist, but she would like to look at her insurance benefits. No mention of being SI or HI.   Outpatient Encounter Medications as of 05/20/2024  Medication Sig   Ferrous Sulfate (IRON) 325 (65 Fe) MG TABS    hydrOXYzine (ATARAX) 25 MG tablet Take 25 mg by mouth as needed.   Multiple Vitamins-Minerals (MULTIVITAMIN WOMEN) TABS Take 1 tablet  by mouth daily.   rizatriptan  (MAXALT ) 10 MG tablet Take 1 tablet (10 mg total) by mouth as needed for migraine. May repeat in 2 hours if needed   TRI-LO-MARZIA 0.18/0.215/0.25 MG-25 MCG tab Take 1 tablet by mouth daily.   No facility-administered encounter medications on file as of 05/20/2024.    Past Medical History:  Diagnosis Date   Anemia    Anxiety    Bleach ingestion    high school   Depression    Gallstone    Overdose    high school   Seizures (HCC)    Tachycardia    history of 2016, has seen cardiologist    Past Surgical History:  Procedure Laterality Date   CHOLECYSTECTOMY      Family History  Problem Relation Age of Onset   Cancer Mother    Seizures Maternal Grandmother    Cancer Maternal Grandmother    Leukemia Paternal Grandfather    Depression Paternal Uncle    Learning disabilities Sister     Social History   Socioeconomic History   Marital status: Single    Spouse name: Not on file   Number of children: 0   Years of education: Not on file   Highest education level: Master's degree (e.g., MA, MS, MEng, MEd, MSW, MBA)  Occupational History   Not on file  Tobacco Use   Smoking status: Former    Current packs/day: 0.00    Average packs/day: 0.3 packs/day for 0.5 years (0.1 ttl pk-yrs)  Types: Cigarettes    Quit date: 10/04/2013    Years since quitting: 10.6   Smokeless tobacco: Never   Tobacco comments:    Quit 2015. 2 packs a week.  Vaping Use   Vaping status: Never Used  Substance and Sexual Activity   Alcohol use: Yes    Alcohol/week: 1.0 standard drink of alcohol    Types: 1 Standard drinks or equivalent per week    Comment: occasionally-a drink maybe once a week.   Drug use: Never   Sexual activity: Yes    Birth control/protection: Condom, Pill  Other Topics Concern   Not on file  Social History Narrative   Caffiene 1 cups daily or 2 shots expresso daily   Working gaffer (teach).    Live partner, 1 cat (indoor).    Social Drivers of Health   Tobacco Use: Medium Risk (05/20/2024)   Patient History    Smoking Tobacco Use: Former    Smokeless Tobacco Use: Never    Passive Exposure: Not on file  Financial Resource Strain: Low Risk (05/18/2024)   Overall Financial Resource Strain (CARDIA)    Difficulty of Paying Living Expenses: Not very hard  Food Insecurity: Food Insecurity Present (05/18/2024)   Epic    Worried About Programme Researcher, Broadcasting/film/video in the Last Year: Sometimes true    Ran Out of Food in the Last Year: Sometimes true  Transportation Needs: No Transportation Needs (05/18/2024)   Epic    Lack of Transportation (Medical): No    Lack of Transportation (Non-Medical): No  Physical Activity: Insufficiently Active (05/18/2024)   Exercise Vital Sign    Days of Exercise per Week: 3 days    Minutes of Exercise per Session: 40 min  Stress: Stress Concern Present (05/18/2024)   [employer] of Occupational Health - Occupational Stress Questionnaire    Feeling of Stress: To some extent  Social Connections: Socially Isolated (05/18/2024)   Social Connection and Isolation Panel    Frequency of Communication with Friends and Family: Once a week    Frequency of Social Gatherings with Friends and Family: Once a week    Attends Religious Services: Never    Database Administrator or Organizations: No    Attends Engineer, Structural: Not on file    Marital Status: Living with partner  Intimate Partner Violence: Not At Risk (05/20/2024)   Epic    Fear of Current or Ex-Partner: No    Emotionally Abused: No    Physically Abused: No    Sexually Abused: No  Depression (PHQ2-9): Low Risk (05/20/2024)   Depression (PHQ2-9)    PHQ-2 Score: 4  Alcohol Screen: Low Risk (05/18/2024)   Alcohol Screen    Last Alcohol Screening Score (AUDIT): 2  Housing: Low Risk (05/20/2024)   Epic    Unable to Pay for Housing in the Last Year: No    Number of Times Moved in the Last Year: 0    Homeless in the Last Year:  No  Utilities: Not At Risk (05/20/2024)   Epic    Threatened with loss of utilities: No  Health Literacy: Adequate Health Literacy (05/20/2024)   B1300 Health Literacy    Frequency of need for help with medical instructions: Never    ROS See HPI above    Objective  BP 122/72   Pulse 100   Temp 98.2 F (36.8 C) (Oral)   Ht 5' 5 (1.651 m)   Wt 162 lb (73.5 kg)   SpO2  98%   BMI 26.96 kg/m   Physical Exam Vitals reviewed.  Constitutional:      General: She is not in acute distress.    Appearance: Normal appearance. She is overweight. She is not ill-appearing, toxic-appearing or diaphoretic.  HENT:     Head: Normocephalic and atraumatic.  Eyes:     General:        Right eye: No discharge.        Left eye: No discharge.     Conjunctiva/sclera: Conjunctivae normal.  Cardiovascular:     Rate and Rhythm: Normal rate and regular rhythm.     Heart sounds: Normal heart sounds. No murmur heard.    No friction rub. No gallop.  Pulmonary:     Effort: Pulmonary effort is normal. No respiratory distress.     Breath sounds: Normal breath sounds.  Musculoskeletal:        General: Normal range of motion.     Right lower leg: No edema.     Left lower leg: No edema.  Skin:    General: Skin is warm and dry.  Neurological:     General: No focal deficit present.     Mental Status: She is alert and oriented to person, place, and time. Mental status is at baseline.  Psychiatric:        Mood and Affect: Mood normal.        Behavior: Behavior normal.        Thought Content: Thought content normal.        Judgment: Judgment normal.   EKG completed due to chest pain. NSR with no ST elevation, HR 82. Last EKG was sinus tachy at 103 on 05/10/2021. No significant changes.     Assessment & Plan:  Chest pain, unspecified type -     EKG 12-Lead -     CBC with Differential/Platelet -     Comprehensive metabolic panel with GFR -     Magnesium -     TSH -     LONG TERM MONITOR (3-14 DAYS);  Future  Palpitations -     LONG TERM MONITOR (3-14 DAYS); Future  Immunization due -     Flu vaccine trivalent PF, 6mos and older(Flulaval,Afluria,Fluarix,Fluzone)  Insomnia, unspecified type Assessment & Plan: Continue taking Hydroxyzine 25mg  PRN at bedtime.    Encounter for initial prescription of contraceptive pills -     Pregnancy, urine  Overweight (BMI 25.0-29.9) -     CBC with Differential/Platelet -     Comprehensive metabolic panel with GFR -     Hemoglobin A1c -     Magnesium -     TSH -     VITAMIN D  25 Hydroxy (Vit-D Deficiency, Fractures)  Vitamin B12 deficiency -     Vitamin B12  Encounter to establish care  1.Review health maintenance: -HIV and Hep C screening: Declines  -Tdap vaccine: Upload immunization records through MyChart  -Hep B vaccine: Upload immunization records through MyChart  -Cervical cancer screening: November 2025 with [facility]  -Covid booster: Declines  -Influenza: Administered  -Continue all medications.  2.Urine pregnancy ordered to refill oral birth control. If it is negative, will refill medication.  3..EKG completed for chest pain. Normal findings. 4.Ordered labs based on symptoms of chest pain, BMI-overweight, and previous vitamin B12 deficiency. Office will call with lab results and will be available via MyChart.  5.Ordered a heart monitor. Discussed about the process of the monitor. 6.May send a message of placing a referral  to psychology or psychiatrist  and preference.  Return in about 1 month (around 06/20/2024) for follow-up.   Verlia Kaney, NP "

## 2024-05-20 NOTE — Assessment & Plan Note (Signed)
 Continue taking Hydroxyzine 25mg  PRN at bedtime.

## 2024-05-20 NOTE — Progress Notes (Unsigned)
 EP to read.

## 2024-05-20 NOTE — Patient Instructions (Addendum)
-  It was nice to meet you and look forward to taking care of you.  -Influenza vaccine provided. -Recommend to upload immunization records to MyChart.  -Continue all medications.  -Urine pregnancy ordered to refill oral birth control. If it is negative, will refill medication.  -EKG completed for chest pain. Normal findings. -Ordered labs based on symptoms of chest pain, BMI-overweight, and previous vitamin B12 deficiency. Office will call with lab results and will be available via MyChart.  -Ordered a heart monitor. Discussed about the process of the monitor. -May send a message of placing a referral to psychology or psychiatrist  and preference.  -Follow up in 1 month.

## 2024-05-21 LAB — PREGNANCY, URINE: Preg Test, Ur: NEGATIVE

## 2024-05-31 ENCOUNTER — Other Ambulatory Visit: Payer: Self-pay

## 2024-05-31 MED ORDER — TRI-LO-MARZIA 0.18/0.215/0.25 MG-25 MCG PO TABS: 1.0000 | ORAL_TABLET | Freq: Every day | ORAL | 3 refills | Status: AC

## 2024-06-22 ENCOUNTER — Ambulatory Visit: Admitting: Family Medicine
